# Patient Record
Sex: Female | Born: 1955 | Race: Asian | Hispanic: No | Marital: Married | Smoking: Never smoker
Health system: Southern US, Community
[De-identification: ages and names within clinical notes are randomized; demographics above are authoritative.]

## PROBLEM LIST (undated history)

## (undated) ENCOUNTER — Emergency Department (HOSPITAL_COMMUNITY): Payer: Self-pay

## (undated) DIAGNOSIS — I10 Essential (primary) hypertension: Secondary | ICD-10-CM

## (undated) DIAGNOSIS — T7840XA Allergy, unspecified, initial encounter: Secondary | ICD-10-CM

## (undated) DIAGNOSIS — E785 Hyperlipidemia, unspecified: Secondary | ICD-10-CM

## (undated) HISTORY — PX: EYE SURGERY: SHX253

## (undated) HISTORY — DX: Allergy, unspecified, initial encounter: T78.40XA

## (undated) HISTORY — DX: Hyperlipidemia, unspecified: E78.5

## (undated) HISTORY — DX: Essential (primary) hypertension: I10

---

## 2004-06-20 ENCOUNTER — Ambulatory Visit: Payer: Self-pay | Admitting: Family Medicine

## 2004-06-28 ENCOUNTER — Ambulatory Visit: Payer: Self-pay | Admitting: Family Medicine

## 2004-06-29 ENCOUNTER — Ambulatory Visit: Payer: Self-pay | Admitting: *Deleted

## 2004-09-23 ENCOUNTER — Ambulatory Visit: Payer: Self-pay | Admitting: Internal Medicine

## 2004-09-29 ENCOUNTER — Ambulatory Visit: Payer: Self-pay | Admitting: Family Medicine

## 2004-10-26 ENCOUNTER — Ambulatory Visit: Payer: Self-pay | Admitting: Internal Medicine

## 2004-10-31 ENCOUNTER — Ambulatory Visit: Payer: Self-pay | Admitting: Family Medicine

## 2004-11-01 ENCOUNTER — Ambulatory Visit: Payer: Self-pay | Admitting: *Deleted

## 2005-03-21 ENCOUNTER — Ambulatory Visit: Payer: Self-pay | Admitting: Family Medicine

## 2005-04-21 ENCOUNTER — Ambulatory Visit: Payer: Self-pay | Admitting: Family Medicine

## 2005-11-22 ENCOUNTER — Ambulatory Visit: Payer: Self-pay | Admitting: Family Medicine

## 2005-12-05 ENCOUNTER — Ambulatory Visit: Payer: Self-pay | Admitting: Family Medicine

## 2006-03-27 ENCOUNTER — Ambulatory Visit: Payer: Self-pay | Admitting: Family Medicine

## 2006-07-18 ENCOUNTER — Ambulatory Visit: Payer: Self-pay | Admitting: Family Medicine

## 2006-08-06 ENCOUNTER — Ambulatory Visit: Payer: Self-pay | Admitting: Family Medicine

## 2006-08-07 ENCOUNTER — Ambulatory Visit: Payer: Self-pay | Admitting: Family Medicine

## 2006-09-17 ENCOUNTER — Ambulatory Visit: Payer: Self-pay | Admitting: Family Medicine

## 2006-09-24 ENCOUNTER — Ambulatory Visit: Payer: Self-pay | Admitting: Family Medicine

## 2006-10-02 ENCOUNTER — Ambulatory Visit: Payer: Self-pay | Admitting: Family Medicine

## 2006-12-04 ENCOUNTER — Ambulatory Visit: Payer: Self-pay | Admitting: Internal Medicine

## 2006-12-17 ENCOUNTER — Ambulatory Visit: Payer: Self-pay | Admitting: Family Medicine

## 2007-03-20 ENCOUNTER — Encounter (INDEPENDENT_AMBULATORY_CARE_PROVIDER_SITE_OTHER): Payer: Self-pay | Admitting: *Deleted

## 2007-06-04 ENCOUNTER — Ambulatory Visit: Payer: Self-pay | Admitting: Internal Medicine

## 2007-09-09 ENCOUNTER — Encounter: Payer: Self-pay | Admitting: Family Medicine

## 2007-09-09 ENCOUNTER — Ambulatory Visit: Payer: Self-pay | Admitting: Internal Medicine

## 2007-09-09 ENCOUNTER — Encounter (INDEPENDENT_AMBULATORY_CARE_PROVIDER_SITE_OTHER): Payer: Self-pay | Admitting: Family Medicine

## 2007-09-09 LAB — CONVERTED CEMR LAB
ALT: 20 units/L (ref 0–35)
AST: 23 units/L (ref 0–37)
Basophils Absolute: 0.1 10*3/uL (ref 0.0–0.1)
Basophils Relative: 1 % (ref 0–1)
CO2: 25 meq/L (ref 19–32)
Cholesterol: 205 mg/dL — ABNORMAL HIGH (ref 0–200)
Creatinine, Ser: 0.82 mg/dL (ref 0.40–1.20)
Eosinophils Relative: 2 % (ref 0–5)
HCT: 42.9 % (ref 36.0–46.0)
HDL: 58 mg/dL (ref >39)
Hemoglobin: 13.8 g/dL (ref 12.0–15.0)
MCHC: 32.2 g/dL (ref 30.0–36.0)
Monocytes Absolute: 0.5 10*3/uL (ref 0.1–1.0)
RDW: 14.1 % (ref 11.5–15.5)
Total Bilirubin: 0.6 mg/dL (ref 0.3–1.2)
Total CHOL/HDL Ratio: 3.5
VLDL: 36 mg/dL (ref 0–40)

## 2007-09-12 ENCOUNTER — Ambulatory Visit (HOSPITAL_COMMUNITY): Admission: RE | Admit: 2007-09-12 | Discharge: 2007-09-12 | Payer: Self-pay | Admitting: Family Medicine

## 2007-09-23 ENCOUNTER — Ambulatory Visit: Payer: Self-pay | Admitting: Internal Medicine

## 2007-10-28 ENCOUNTER — Ambulatory Visit: Payer: Self-pay | Admitting: Internal Medicine

## 2007-10-28 ENCOUNTER — Encounter (INDEPENDENT_AMBULATORY_CARE_PROVIDER_SITE_OTHER): Payer: Self-pay | Admitting: Family Medicine

## 2007-10-28 LAB — CONVERTED CEMR LAB
Bilirubin Urine: NEGATIVE
Hemoglobin, Urine: NEGATIVE
Protein, ur: NEGATIVE mg/dL
Urobilinogen, UA: 0.2 (ref 0.0–1.0)
WBC, UA: NONE SEEN cells/hpf (ref <3)

## 2007-10-31 ENCOUNTER — Ambulatory Visit: Payer: Self-pay | Admitting: Family Medicine

## 2008-04-03 ENCOUNTER — Ambulatory Visit: Payer: Self-pay | Admitting: Internal Medicine

## 2008-04-09 ENCOUNTER — Ambulatory Visit: Payer: Self-pay | Admitting: Internal Medicine

## 2008-05-01 ENCOUNTER — Ambulatory Visit: Payer: Self-pay | Admitting: Internal Medicine

## 2008-05-01 ENCOUNTER — Encounter (INDEPENDENT_AMBULATORY_CARE_PROVIDER_SITE_OTHER): Payer: Self-pay | Admitting: Adult Health

## 2008-05-01 LAB — CONVERTED CEMR LAB
AST: 25 units/L (ref 0–37)
Alkaline Phosphatase: 42 units/L (ref 39–117)
BUN: 11 mg/dL (ref 6–23)
Basophils Relative: 1 % (ref 0–1)
Creatinine, Ser: 0.69 mg/dL (ref 0.40–1.20)
Eosinophils Absolute: 0.1 10*3/uL (ref 0.0–0.7)
Eosinophils Relative: 2 % (ref 0–5)
Glucose, Bld: 103 mg/dL — ABNORMAL HIGH (ref 70–99)
HCT: 42.7 % (ref 36.0–46.0)
HDL: 56 mg/dL (ref >39)
Hemoglobin: 14 g/dL (ref 12.0–15.0)
LDL Cholesterol: 101 mg/dL — ABNORMAL HIGH (ref 0–99)
Lymphs Abs: 1.6 10*3/uL (ref 0.7–4.0)
MCHC: 32.8 g/dL (ref 30.0–36.0)
MCV: 84.7 fL (ref 78.0–100.0)
Monocytes Absolute: 0.4 10*3/uL (ref 0.1–1.0)
Monocytes Relative: 7 % (ref 3–12)
RBC: 5.04 M/uL (ref 3.87–5.11)
T4, Total: 7.1 ug/dL (ref 5.0–12.5)
Total CHOL/HDL Ratio: 3.3
Triglycerides: 126 mg/dL (ref <150)

## 2008-07-06 ENCOUNTER — Ambulatory Visit: Payer: Self-pay | Admitting: Internal Medicine

## 2008-07-06 ENCOUNTER — Encounter (INDEPENDENT_AMBULATORY_CARE_PROVIDER_SITE_OTHER): Payer: Self-pay | Admitting: Adult Health

## 2008-07-06 LAB — CONVERTED CEMR LAB
ALT: 42 units/L — ABNORMAL HIGH (ref 0–35)
AST: 31 units/L (ref 0–37)
Alkaline Phosphatase: 49 units/L (ref 39–117)
BUN: 14 mg/dL (ref 6–23)
Basophils Absolute: 0.1 10*3/uL (ref 0.0–0.1)
Basophils Relative: 1 % (ref 0–1)
Calcium: 10 mg/dL (ref 8.4–10.5)
Chloride: 100 meq/L (ref 96–112)
Creatinine, Ser: 0.7 mg/dL (ref 0.40–1.20)
Eosinophils Absolute: 0.1 10*3/uL (ref 0.0–0.7)
HDL: 59 mg/dL (ref >39)
Helicobacter Pylori Antibody-IgG: <0.4
Hemoglobin: 14.9 g/dL (ref 12.0–15.0)
LDL Cholesterol: 109 mg/dL — ABNORMAL HIGH (ref 0–99)
MCHC: 33.7 g/dL (ref 30.0–36.0)
MCV: 81.1 fL (ref 78.0–100.0)
Monocytes Absolute: 0.5 10*3/uL (ref 0.1–1.0)
Monocytes Relative: 7 % (ref 3–12)
Neutro Abs: 4.1 10*3/uL (ref 1.7–7.7)
Neutrophils Relative %: 60 % (ref 43–77)
RDW: 13.4 % (ref 11.5–15.5)
Total CHOL/HDL Ratio: 3.5
VLDL: 36 mg/dL (ref 0–40)

## 2008-07-09 ENCOUNTER — Ambulatory Visit (HOSPITAL_COMMUNITY): Admission: RE | Admit: 2008-07-09 | Discharge: 2008-07-09 | Payer: Self-pay | Admitting: Internal Medicine

## 2008-07-09 ENCOUNTER — Encounter: Payer: Self-pay | Admitting: Internal Medicine

## 2008-07-20 ENCOUNTER — Ambulatory Visit: Payer: Self-pay | Admitting: Internal Medicine

## 2008-08-04 ENCOUNTER — Ambulatory Visit: Payer: Self-pay | Admitting: Internal Medicine

## 2013-09-22 ENCOUNTER — Other Ambulatory Visit: Payer: Self-pay | Admitting: Internal Medicine

## 2013-09-22 DIAGNOSIS — Z1231 Encounter for screening mammogram for malignant neoplasm of breast: Secondary | ICD-10-CM

## 2013-10-02 ENCOUNTER — Ambulatory Visit
Admission: RE | Admit: 2013-10-02 | Discharge: 2013-10-02 | Disposition: A | Discharge status: Home / Self Care | Payer: No Typology Code available for payment source | Source: Ambulatory Visit | Attending: Internal Medicine | Admitting: Internal Medicine

## 2013-10-02 DIAGNOSIS — Z1231 Encounter for screening mammogram for malignant neoplasm of breast: Secondary | ICD-10-CM

## 2013-10-07 ENCOUNTER — Other Ambulatory Visit: Payer: Self-pay | Admitting: Internal Medicine

## 2013-10-07 DIAGNOSIS — R928 Other abnormal and inconclusive findings on diagnostic imaging of breast: Secondary | ICD-10-CM

## 2013-11-04 ENCOUNTER — Encounter (INDEPENDENT_AMBULATORY_CARE_PROVIDER_SITE_OTHER): Payer: Self-pay

## 2013-11-04 ENCOUNTER — Ambulatory Visit
Admission: RE | Admit: 2013-11-04 | Discharge: 2013-11-04 | Disposition: A | Discharge status: Home / Self Care | Payer: No Typology Code available for payment source | Source: Ambulatory Visit | Attending: Internal Medicine | Admitting: Internal Medicine

## 2013-11-04 DIAGNOSIS — R928 Other abnormal and inconclusive findings on diagnostic imaging of breast: Secondary | ICD-10-CM

## 2014-02-10 ENCOUNTER — Ambulatory Visit (INDEPENDENT_AMBULATORY_CARE_PROVIDER_SITE_OTHER): Payer: No Typology Code available for payment source

## 2014-02-10 ENCOUNTER — Ambulatory Visit (INDEPENDENT_AMBULATORY_CARE_PROVIDER_SITE_OTHER): Payer: No Typology Code available for payment source | Admitting: Internal Medicine

## 2014-02-10 VITALS — BP 116/72 | HR 72 | Temp 98.0°F | Resp 20 | Ht 65.5 in | Wt 137.2 lb

## 2014-02-10 DIAGNOSIS — S39012A Strain of muscle, fascia and tendon of lower back, initial encounter: Secondary | ICD-10-CM

## 2014-02-10 DIAGNOSIS — M5430 Sciatica, unspecified side: Secondary | ICD-10-CM

## 2014-02-10 DIAGNOSIS — M5442 Lumbago with sciatica, left side: Secondary | ICD-10-CM

## 2014-02-10 DIAGNOSIS — M5441 Lumbago with sciatica, right side: Secondary | ICD-10-CM

## 2014-02-10 DIAGNOSIS — S335XXA Sprain of ligaments of lumbar spine, initial encounter: Secondary | ICD-10-CM

## 2014-02-10 MED ORDER — METHOCARBAMOL 750 MG PO TABS: 750.0000 mg | ORAL_TABLET | Freq: Four times a day (QID) | ORAL | Status: DC

## 2014-02-10 MED ORDER — HYDROCODONE-ACETAMINOPHEN 5-325 MG PO TABS: 1.0000 | ORAL_TABLET | Freq: Four times a day (QID) | ORAL | Status: DC | PRN

## 2014-02-10 MED ORDER — PREDNISONE 10 MG PO TABS: ORAL_TABLET | ORAL | Status: DC

## 2014-02-10 NOTE — Patient Instructions (Signed)

## 2014-02-10 NOTE — Progress Notes (Signed)
   Subjective:    Patient ID: Michelle Moran, female    DOB: October 03, 1955, 58 y.o.   MRN: [id number]  HPI    Review of Systems     Objective:   Physical Exam        Assessment & Plan:

## 2014-02-10 NOTE — Progress Notes (Signed)
   Subjective:    Patient ID: Michelle BrockRekha Moran, female    DOB: 1955-08-22, 58 y.o.   MRN: [id number]  HPI  Pt presents today with back pain, right worse than left. Noticed pain 4-5 days ago at home after lifting a heavy object at work.   Right leg radiculopathy. No numbness, no weakness, no incontinence.    Review of Systems     Objective:   Physical Exam  Constitutional: She is oriented to person, place, and time. She appears well-developed and well-nourished. She appears distressed.  HENT:  Head: Normocephalic.  Eyes: EOM are normal.  Neck: Normal range of motion.  Pulmonary/Chest: Effort normal.  Abdominal: Soft. There is no tenderness.  Musculoskeletal: She exhibits tenderness.  Neurological: She is alert and oriented to person, place, and time. She has normal strength and normal reflexes. She displays normal reflexes. No cranial nerve deficit or sensory deficit. She exhibits normal muscle tone. Coordination normal.  Reflex Scores:      Patellar reflexes are 2+ on the right side and 2+ on the left side.      Achilles reflexes are 2+ on the right side and 2+ on the left side. Straight leg raise is negative  Skin: No rash noted.  Psychiatric: She has a normal mood and affect. Her behavior is normal. Judgment and thought content normal.    [facility] reading (PRIMARY) by  Dr.Maliha Outten normal back xr        Assessment & Plan:  LB strain with right radiculopathy Prednisone/Vicodin/Robaxin Rest/No bending or lifting RTC 1 week

## 2014-11-05 ENCOUNTER — Other Ambulatory Visit: Payer: Self-pay | Admitting: Internal Medicine

## 2014-11-05 DIAGNOSIS — N63 Unspecified lump in unspecified breast: Secondary | ICD-10-CM

## 2014-11-11 ENCOUNTER — Other Ambulatory Visit: Payer: No Typology Code available for payment source

## 2014-11-17 ENCOUNTER — Ambulatory Visit
Admission: RE | Admit: 2014-11-17 | Discharge: 2014-11-17 | Disposition: A | Discharge status: Home / Self Care | Payer: 59 | Source: Ambulatory Visit | Attending: Internal Medicine | Admitting: Internal Medicine

## 2014-11-17 DIAGNOSIS — N63 Unspecified lump in unspecified breast: Secondary | ICD-10-CM

## 2019-03-04 ENCOUNTER — Encounter: Payer: Self-pay | Admitting: Cardiology

## 2019-03-04 ENCOUNTER — Inpatient Hospital Stay (HOSPITAL_COMMUNITY)
Admission: AD | Admit: 2019-03-04 | Discharge: 2019-03-06 | DRG: 244 | Disposition: A | Discharge status: Home / Self Care | Payer: Medicaid Other | Source: Ambulatory Visit | Attending: Cardiology | Admitting: Cardiology

## 2019-03-04 ENCOUNTER — Ambulatory Visit: Payer: Medicaid Other | Admitting: Cardiology

## 2019-03-04 ENCOUNTER — Other Ambulatory Visit: Payer: Self-pay

## 2019-03-04 VITALS — BP 184/81 | HR 40 | Temp 97.7°F | Ht 65.0 in | Wt 152.0 lb

## 2019-03-04 DIAGNOSIS — R0600 Dyspnea, unspecified: Secondary | ICD-10-CM

## 2019-03-04 DIAGNOSIS — K219 Gastro-esophageal reflux disease without esophagitis: Secondary | ICD-10-CM | POA: Diagnosis present

## 2019-03-04 DIAGNOSIS — I452 Bifascicular block: Secondary | ICD-10-CM | POA: Diagnosis present

## 2019-03-04 DIAGNOSIS — R7303 Prediabetes: Secondary | ICD-10-CM | POA: Diagnosis present

## 2019-03-04 DIAGNOSIS — R0609 Other forms of dyspnea: Secondary | ICD-10-CM | POA: Diagnosis present

## 2019-03-04 DIAGNOSIS — R42 Dizziness and giddiness: Secondary | ICD-10-CM

## 2019-03-04 DIAGNOSIS — Z79899 Other long term (current) drug therapy: Secondary | ICD-10-CM

## 2019-03-04 DIAGNOSIS — Z9049 Acquired absence of other specified parts of digestive tract: Secondary | ICD-10-CM

## 2019-03-04 DIAGNOSIS — I10 Essential (primary) hypertension: Secondary | ICD-10-CM | POA: Insufficient documentation

## 2019-03-04 DIAGNOSIS — Z8249 Family history of ischemic heart disease and other diseases of the circulatory system: Secondary | ICD-10-CM | POA: Diagnosis not present

## 2019-03-04 DIAGNOSIS — Z20828 Contact with and (suspected) exposure to other viral communicable diseases: Secondary | ICD-10-CM | POA: Diagnosis present

## 2019-03-04 DIAGNOSIS — R001 Bradycardia, unspecified: Secondary | ICD-10-CM

## 2019-03-04 DIAGNOSIS — R55 Syncope and collapse: Secondary | ICD-10-CM | POA: Diagnosis not present

## 2019-03-04 DIAGNOSIS — I442 Atrioventricular block, complete: Secondary | ICD-10-CM

## 2019-03-04 DIAGNOSIS — E782 Mixed hyperlipidemia: Secondary | ICD-10-CM | POA: Insufficient documentation

## 2019-03-04 DIAGNOSIS — E785 Hyperlipidemia, unspecified: Secondary | ICD-10-CM | POA: Diagnosis present

## 2019-03-04 DIAGNOSIS — Z950 Presence of cardiac pacemaker: Secondary | ICD-10-CM

## 2019-03-04 HISTORY — DX: Atrioventricular block, complete: I44.2

## 2019-03-04 LAB — CBC
HCT: 40.3 % (ref 36.0–46.0)
Hemoglobin: 13.6 g/dL (ref 12.0–15.0)
MCH: 28.7 pg (ref 26.0–34.0)
MCHC: 33.7 g/dL (ref 30.0–36.0)
MCV: 85 fL (ref 80.0–100.0)
Platelets: 265 10*3/uL (ref 150–400)
RBC: 4.74 MIL/uL (ref 3.87–5.11)
RDW: 13.1 % (ref 11.5–15.5)
WBC: 8.2 10*3/uL (ref 4.0–10.5)
nRBC: 0 % (ref 0.0–0.2)

## 2019-03-04 LAB — COMPREHENSIVE METABOLIC PANEL
ALT: 58 U/L — ABNORMAL HIGH (ref 0–44)
AST: 35 U/L (ref 15–41)
Albumin: 3.7 g/dL (ref 3.5–5.0)
Alkaline Phosphatase: 42 U/L (ref 38–126)
Anion gap: 12 (ref 5–15)
BUN: 9 mg/dL (ref 8–23)
CO2: 21 mmol/L — ABNORMAL LOW (ref 22–32)
Calcium: 9.2 mg/dL (ref 8.9–10.3)
Chloride: 105 mmol/L (ref 98–111)
Creatinine, Ser: 0.88 mg/dL (ref 0.44–1.00)
GFR calc Af Amer: >60 mL/min (ref >60)
GFR calc non Af Amer: >60 mL/min (ref >60)
Glucose, Bld: 177 mg/dL — ABNORMAL HIGH (ref 70–99)
Potassium: 3.9 mmol/L (ref 3.5–5.1)
Sodium: 138 mmol/L (ref 135–145)
Total Bilirubin: 0.9 mg/dL (ref 0.3–1.2)
Total Protein: 7 g/dL (ref 6.5–8.1)

## 2019-03-04 LAB — SARS CORONAVIRUS 2 (TAT 6-24 HRS): SARS Coronavirus 2: NEGATIVE

## 2019-03-04 MED ORDER — HYDRALAZINE HCL 20 MG/ML IJ SOLN: 10.0000 mg | INTRAMUSCULAR | Status: DC | PRN

## 2019-03-04 MED ORDER — SODIUM CHLORIDE 0.9% FLUSH
3.0000 mL | Freq: Two times a day (BID) | INTRAVENOUS | Status: DC
  Administered 2019-03-04 – 2019-03-05 (×2): 3 mL via INTRAVENOUS

## 2019-03-04 NOTE — Progress Notes (Signed)
Pts attending Dr. Virgina Jock notified of patients HR sustaining at 49. Pt asymptomatic. No further orders given at this time but to continue to monitor overnight unless patient becomes hemodynamically unstable. Per MD can expect patients heart rate to drop into the 30s overnight while sleeping. Pt to have pacemaker placement 03/05/2019. Pads on patient and defibrillator outside of room per order. Will continue to monitor.

## 2019-03-04 NOTE — Progress Notes (Signed)
Patient arrived as a direct admit to 4e18 patient placed on monitor and vital signs obtained and CHG completed. Patient oriented to room. Will monitor patient. Maximilien Hayashi, Bettina Gavia rN

## 2019-03-04 NOTE — Progress Notes (Signed)
EKG 02/12/2019 at PCP office

## 2019-03-04 NOTE — Addendum Note (Signed)
Addended by: Nigel Mormon on: 03/04/2019 04:41 PM   Modules accepted: Level of Service

## 2019-03-04 NOTE — Progress Notes (Signed)
Pt with orders for xray. Per xray until COVID screening comes back negative they cant take the patient. Asking nurse to call back when COVID results are in. Will continue to monitor.

## 2019-03-04 NOTE — H&P (Signed)
Office note from earlier today.Direct admit from office.   Patient referred by Bonnita Nasuti, MD for dizziness, bradycardia  Subjective:   Michelle Moran, female    DOB: Jan 10, 1956, 63 y.o.   MRN: [id number]      Chief Complaint  Patient presents with  . Bradycardia  . Dizziness  . New Patient (Initial Visit)     HPI  63 y.o. [place] female with hypertension, hyperlipidemia, GERD, referred for evaluation of dizziness and bradycardia.  Patient has been experiencing dizziness, especially with climbing upstairs, associated with exertional dyspnea. Two weeks ago, she had a sudden syncope while in shower, fortunately without any major injury.  She complains of left-sided focal, reproducible chest pain, that is present at rest and not worse on exertion.  Patient was seen by her PCP Dr. Jannette Fogo 4 days ago.  While her baseline EKG was normal in the past, EKG at Dr. Tenna Delaine office showed sinus rhythm, 2: 1 AV block, left bundle branch block.  He stopped patient's atenolol 25 mg.  However, patient continued to have symptoms.  EKG in the office today showed sinus rhythm, third-degree AV block, junctional escape rhythm at 40 bpm with right bundle branch block.       Past Medical History:  Diagnosis Date  . Allergy           Past Surgical History:  Procedure Laterality Date  . EYE SURGERY       Social History        Socioeconomic History  . Marital status: Married    Spouse name: Not on file  . Number of children: Not on file  . Years of education: Not on file  . Highest education level: Not on file  Occupational History  . Not on file  Social Needs  . Financial resource strain: Not on file  . Food insecurity    Worry: Not on file    Inability: Not on file  . Transportation needs    Medical: Not on file    Non-medical: Not on file  Tobacco Use  . Smoking status: Never Smoker  . Smokeless tobacco: Never Used  Substance and Sexual Activity   . Alcohol use: No  . Drug use: No  . Sexual activity: Not on file  Lifestyle  . Physical activity    Days per week: Not on file    Minutes per session: Not on file  . Stress: Not on file  Relationships  . Social Herbalist on phone: Not on file    Gets together: Not on file    Attends religious service: Not on file    Active member of club or organization: Not on file    Attends meetings of clubs or organizations: Not on file    Relationship status: Not on file  . Intimate partner violence    Fear of current or ex partner: Not on file    Emotionally abused: Not on file    Physically abused: Not on file    Forced sexual activity: Not on file  Other Topics Concern  . Not on file  Social History Narrative  . Not on file          Family History  Problem Relation Age of Onset  . Heart disease Father            Current Outpatient Medications on File Prior to Visit  Medication Sig Dispense Refill  . atenolol (TENORMIN) 25 MG tablet Take 25 mg  by mouth daily.    . celecoxib (CELEBREX) 200 MG capsule Take 200 mg by mouth 2 (two) times daily.    Marland Kitchen HYDROcodone-acetaminophen (NORCO/VICODIN) 5-325 MG per tablet Take 1 tablet by mouth every 6 (six) hours as needed. 30 tablet 0  . methocarbamol (ROBAXIN-750) 750 MG tablet Take 1 tablet (750 mg total) by mouth 4 (four) times daily. 40 tablet 1  . pravastatin (PRAVACHOL) 20 MG tablet Take 20 mg by mouth daily.    . predniSONE (DELTASONE) 10 MG tablet 6-5-4-3-2-1 po pc for back pain 21 tablet 0   No current facility-administered medications on file prior to visit.     Cardiovascular studies:  EKG 03/04/2019:  Sinus rhythm with third-degree AV block.   Junctional escape rhythm with underlying right bundle branch block, 42 bpm.   Left atrial enlargement.   Recent labs: 02/12/2019: Glucose 97.  BUN/creatinine 16/0.85.  EGFR 72.  Sodium 139, potassium 4.3.  AST 71, ALT 39. H/H  13.8/40.4.  MCV 83.  Platelets 309. Total cholesterol 213, TG 196, HDL 54, LDL 120 Hemoglobin A1c 6.1%. TSH 2.1 normal.  Free T4 1.25, mildly elevated.   Free T3 2.32, mildly reduced.   Review of Systems  Constitution: Negative for decreased appetite, malaise/fatigue, weight gain and weight loss.  HENT: Negative for congestion.   Eyes: Negative for visual disturbance.  Cardiovascular: Positive for chest pain (Focal, left sided chest pain, unrelated to exertion), dyspnea on exertion and syncope. Negative for leg swelling and palpitations.  Respiratory: Negative for cough.   Endocrine: Negative for cold intolerance.  Hematologic/Lymphatic: Does not bruise/bleed easily.  Skin: Negative for itching and rash.  Musculoskeletal: Negative for myalgias.  Gastrointestinal: Negative for abdominal pain, nausea and vomiting.  Genitourinary: Negative for dysuria.  Neurological: Positive for dizziness. Negative for weakness.  Psychiatric/Behavioral: The patient is not nervous/anxious.   All other systems reviewed and are negative.           Vitals:   03/04/19 1458  BP: (!) 184/81  Pulse: (!) 40  Temp: 97.7 F (36.5 C)  SpO2: 100%     Body mass index is 25.29 kg/m.    Filed Weights   03/04/19 1458  Weight: 152 lb (68.9 kg)     Objective:   Objective   Physical Exam  Constitutional: She is oriented to person, place, and time. She appears well-developed and well-nourished. No distress.  HENT:  Head: Normocephalic and atraumatic.  Eyes: Pupils are equal, round, and reactive to light. Conjunctivae are normal.  Neck: No JVD present.  Cardiovascular: Regular rhythm and intact distal pulses. Bradycardia present.  No murmur heard. Pulmonary/Chest: Effort normal and breath sounds normal. She has no wheezes. She has no rales.  Abdominal: Soft. Bowel sounds are normal. There is no rebound.  Musculoskeletal:        General: No edema.  Lymphadenopathy:    She has no  cervical adenopathy.  Neurological: She is alert and oriented to person, place, and time. No cranial nerve deficit.  Skin: Skin is warm and dry.  Psychiatric: She has a normal mood and affect.  Nursing note and vitals reviewed.         Assessment & Recommendations:   63 y.o. [place] female with hypertension, hyperlipidemia, prediabetes, GERD, with symptomatic third degree AV block.  Symptomatic third-degree AV block: Progressive conduction disease, now with third-degree AV block and junctional escape rhythm.  Symptoms of exertional dyspnea and unknown syncope are very concerning.  Recommend hospital admission with plan  for pacemaker placement tomorrow.  Will obtain echocardiogram.  If normal EF and normal wall motion abnormality, do not need ischemic work-up prior to pacemaker placement.   Hypertension:  Avoid AV nodal blocking agents.  Hydralazine ordered for as needed use if systolic blood pressure greater than 200 mmHg.  Hyperlipidemia: On pravastatin at baseline.  Nigel Mormon, MD After hours, contact office: [phone]

## 2019-03-04 NOTE — Progress Notes (Addendum)
Patient referred by Bonnita Nasuti, MD for dizziness, bradycardia  Subjective:   Michelle Moran, female    DOB: 07/10/1955, 63 y.o.   MRN: [id number]   Chief Complaint  Patient presents with  . Bradycardia  . Dizziness  . New Patient (Initial Visit)     HPI  63 y.o. [place] female with hypertension, hyperlipidemia, GERD, referred for evaluation of dizziness and bradycardia.  Patient has been experiencing dizziness, especially with climbing upstairs, associated with exertional dyspnea. Two weeks ago, she had a sudden syncope while in shower, fortunately without any major injury.  She complains of left-sided focal, reproducible chest pain, that is present at rest and not worse on exertion.  Patient was seen by her PCP Dr. Jannette Fogo 4 days ago.  While her baseline EKG was normal in the past, EKG at Dr. Tenna Delaine office showed sinus rhythm, 2: 1 AV block, left bundle branch block.  He stopped patient's atenolol 25 mg.  However, patient continued to have symptoms.  EKG in the office today showed sinus rhythm, third-degree AV block, junctional escape rhythm at 40 bpm with right bundle branch block.   Past Medical History:  Diagnosis Date  . Allergy      Past Surgical History:  Procedure Laterality Date  . EYE SURGERY       Social History   Socioeconomic History  . Marital status: Married    Spouse name: Not on file  . Number of children: Not on file  . Years of education: Not on file  . Highest education level: Not on file  Occupational History  . Not on file  Social Needs  . Financial resource strain: Not on file  . Food insecurity    Worry: Not on file    Inability: Not on file  . Transportation needs    Medical: Not on file    Non-medical: Not on file  Tobacco Use  . Smoking status: Never Smoker  . Smokeless tobacco: Never Used  Substance and Sexual Activity  . Alcohol use: No  . Drug use: No  . Sexual activity: Not on file  Lifestyle  . Physical activity     Days per week: Not on file    Minutes per session: Not on file  . Stress: Not on file  Relationships  . Social Herbalist on phone: Not on file    Gets together: Not on file    Attends religious service: Not on file    Active member of club or organization: Not on file    Attends meetings of clubs or organizations: Not on file    Relationship status: Not on file  . Intimate partner violence    Fear of current or ex partner: Not on file    Emotionally abused: Not on file    Physically abused: Not on file    Forced sexual activity: Not on file  Other Topics Concern  . Not on file  Social History Narrative  . Not on file     Family History  Problem Relation Age of Onset  . Heart disease Father      Current Outpatient Medications on File Prior to Visit  Medication Sig Dispense Refill  . atenolol (TENORMIN) 25 MG tablet Take 25 mg by mouth daily.    . celecoxib (CELEBREX) 200 MG capsule Take 200 mg by mouth 2 (two) times daily.    Marland Kitchen HYDROcodone-acetaminophen (NORCO/VICODIN) 5-325 MG per tablet Take 1 tablet by mouth  every 6 (six) hours as needed. 30 tablet 0  . methocarbamol (ROBAXIN-750) 750 MG tablet Take 1 tablet (750 mg total) by mouth 4 (four) times daily. 40 tablet 1  . pravastatin (PRAVACHOL) 20 MG tablet Take 20 mg by mouth daily.    . predniSONE (DELTASONE) 10 MG tablet 6-5-4-3-2-1 po pc for back pain 21 tablet 0   No current facility-administered medications on file prior to visit.     Cardiovascular studies:  EKG 03/04/2019:  Sinus rhythm with third-degree AV block.   Junctional escape rhythm with underlying right bundle branch block, 42 bpm.   Left atrial enlargement.   Recent labs: 02/12/2019: Glucose 97.  BUN/creatinine 16/0.85.  EGFR 72.  Sodium 139, potassium 4.3.  AST 71, ALT 39. H/H 13.8/40.4.  MCV 83.  Platelets 309. Total cholesterol 213, TG 196, HDL 54, LDL 120 Hemoglobin A1c 6.1%. TSH 2.1 normal.  Free T4 1.25, mildly elevated.    Free T3 2.32, mildly reduced.   Review of Systems  Constitution: Negative for decreased appetite, malaise/fatigue, weight gain and weight loss.  HENT: Negative for congestion.   Eyes: Negative for visual disturbance.  Cardiovascular: Positive for chest pain (Focal, left sided chest pain, unrelated to exertion), dyspnea on exertion and syncope. Negative for leg swelling and palpitations.  Respiratory: Negative for cough.   Endocrine: Negative for cold intolerance.  Hematologic/Lymphatic: Does not bruise/bleed easily.  Skin: Negative for itching and rash.  Musculoskeletal: Negative for myalgias.  Gastrointestinal: Negative for abdominal pain, nausea and vomiting.  Genitourinary: Negative for dysuria.  Neurological: Positive for dizziness. Negative for weakness.  Psychiatric/Behavioral: The patient is not nervous/anxious.   All other systems reviewed and are negative.        Vitals:   03/04/19 1458  BP: (!) 184/81  Pulse: (!) 40  Temp: 97.7 F (36.5 C)  SpO2: 100%     Body mass index is 25.29 kg/m. Filed Weights   03/04/19 1458  Weight: 152 lb (68.9 kg)     Objective:   Physical Exam  Constitutional: She is oriented to person, place, and time. She appears well-developed and well-nourished. No distress.  HENT:  Head: Normocephalic and atraumatic.  Eyes: Pupils are equal, round, and reactive to light. Conjunctivae are normal.  Neck: No JVD present.  Cardiovascular: Regular rhythm and intact distal pulses. Bradycardia present.  No murmur heard. Pulmonary/Chest: Effort normal and breath sounds normal. She has no wheezes. She has no rales.  Abdominal: Soft. Bowel sounds are normal. There is no rebound.  Musculoskeletal:        General: No edema.  Lymphadenopathy:    She has no cervical adenopathy.  Neurological: She is alert and oriented to person, place, and time. No cranial nerve deficit.  Skin: Skin is warm and dry.  Psychiatric: She has a normal mood and affect.   Nursing note and vitals reviewed.         Assessment & Recommendations:   63 y.o. [place] female with hypertension, hyperlipidemia, prediabetes, GERD, with symptomatic third degree AV block.  Symptomatic third-degree AV block: Progressive conduction disease, now with third-degree AV block and junctional escape rhythm.  Symptoms of exertional dyspnea and unknown syncope are very concerning.  Recommend hospital admission with plan for pacemaker placement tomorrow.  Will obtain echocardiogram.  If normal EF and normal wall motion abnormality, do not need ischemic work-up prior to pacemaker placement.   Hypertension:  Avoid AV nodal blocking agents.  Hydralazine ordered for as needed use if systolic blood  pressure greater than 200 mmHg.  Hyperlipidemia: On pravastatin at baseline.   Thank you for referring the patient to [place]. Please feel free to contact with any questions.    Nigel Mormon, MD [facility] Pager: [phone] Office: [phone] If no answer Cell [phone]

## 2019-03-05 ENCOUNTER — Encounter (HOSPITAL_COMMUNITY): Payer: Self-pay | Admitting: Internal Medicine

## 2019-03-05 ENCOUNTER — Inpatient Hospital Stay (HOSPITAL_COMMUNITY): Payer: Medicaid Other

## 2019-03-05 ENCOUNTER — Encounter (HOSPITAL_COMMUNITY)
Admission: AD | Disposition: A | Discharge status: Home / Self Care | Payer: Self-pay | Source: Ambulatory Visit | Attending: Cardiology

## 2019-03-05 DIAGNOSIS — I442 Atrioventricular block, complete: Principal | ICD-10-CM

## 2019-03-05 DIAGNOSIS — Z950 Presence of cardiac pacemaker: Secondary | ICD-10-CM

## 2019-03-05 DIAGNOSIS — R55 Syncope and collapse: Secondary | ICD-10-CM

## 2019-03-05 DIAGNOSIS — R0609 Other forms of dyspnea: Secondary | ICD-10-CM

## 2019-03-05 HISTORY — DX: Presence of cardiac pacemaker: Z95.0

## 2019-03-05 HISTORY — PX: PACEMAKER IMPLANT: EP1218

## 2019-03-05 LAB — ECHOCARDIOGRAM COMPLETE
Height: 65 in
Weight: 2440 oz

## 2019-03-05 LAB — SURGICAL PCR SCREEN
MRSA, PCR: NEGATIVE
Staphylococcus aureus: NEGATIVE

## 2019-03-05 LAB — GLUCOSE, CAPILLARY: Glucose-Capillary: 119 mg/dL — ABNORMAL HIGH (ref 70–99)

## 2019-03-05 SURGERY — PACEMAKER IMPLANT

## 2019-03-05 MED ORDER — FENTANYL CITRATE (PF) 100 MCG/2ML IJ SOLN
INTRAMUSCULAR | Status: DC | PRN
  Administered 2019-03-05: 12.5 ug via INTRAVENOUS

## 2019-03-05 MED ORDER — CEFAZOLIN SODIUM-DEXTROSE 2-4 GM/100ML-% IV SOLN
INTRAVENOUS | Status: AC
  Filled 2019-03-05: qty 100

## 2019-03-05 MED ORDER — HEPARIN (PORCINE) IN NACL 1000-0.9 UT/500ML-% IV SOLN
INTRAVENOUS | Status: AC
  Filled 2019-03-05: qty 500

## 2019-03-05 MED ORDER — ONDANSETRON HCL 4 MG/2ML IJ SOLN: 4.0000 mg | Freq: Four times a day (QID) | INTRAMUSCULAR | Status: DC | PRN

## 2019-03-05 MED ORDER — CEFAZOLIN SODIUM-DEXTROSE 1-4 GM/50ML-% IV SOLN
1.0000 g | Freq: Four times a day (QID) | INTRAVENOUS | Status: AC
  Administered 2019-03-05 – 2019-03-06 (×3): 1 g via INTRAVENOUS
  Filled 2019-03-05 (×3): qty 50

## 2019-03-05 MED ORDER — SODIUM CHLORIDE 0.9 % IV SOLN
INTRAVENOUS | Status: AC
  Filled 2019-03-05: qty 2

## 2019-03-05 MED ORDER — HEPARIN (PORCINE) IN NACL 1000-0.9 UT/500ML-% IV SOLN
INTRAVENOUS | Status: DC | PRN
  Administered 2019-03-05: 500 mL

## 2019-03-05 MED ORDER — SODIUM CHLORIDE 0.9 % IV SOLN
80.0000 mg | INTRAVENOUS | Status: AC
  Administered 2019-03-05: 80 mg

## 2019-03-05 MED ORDER — MIDAZOLAM HCL 5 MG/5ML IJ SOLN
INTRAMUSCULAR | Status: AC
  Filled 2019-03-05: qty 5

## 2019-03-05 MED ORDER — SODIUM CHLORIDE 0.9 % IV SOLN: INTRAVENOUS | Status: DC

## 2019-03-05 MED ORDER — CEFAZOLIN SODIUM-DEXTROSE 2-4 GM/100ML-% IV SOLN
2.0000 g | INTRAVENOUS | Status: AC
  Administered 2019-03-05: 12:00:00 2 g via INTRAVENOUS

## 2019-03-05 MED ORDER — FENTANYL CITRATE (PF) 100 MCG/2ML IJ SOLN
INTRAMUSCULAR | Status: AC
  Filled 2019-03-05: qty 2

## 2019-03-05 MED ORDER — LIDOCAINE HCL (PF) 1 % IJ SOLN
INTRAMUSCULAR | Status: AC
  Filled 2019-03-05: qty 90

## 2019-03-05 MED ORDER — MIDAZOLAM HCL 5 MG/5ML IJ SOLN
INTRAMUSCULAR | Status: DC | PRN
  Administered 2019-03-05: 1 mg via INTRAVENOUS

## 2019-03-05 MED ORDER — ACETAMINOPHEN 325 MG PO TABS
325.0000 mg | ORAL_TABLET | ORAL | Status: DC | PRN
  Administered 2019-03-05: 23:00:00 325 mg via ORAL
  Filled 2019-03-05: qty 1

## 2019-03-05 MED ORDER — LIDOCAINE HCL (PF) 1 % IJ SOLN
INTRAMUSCULAR | Status: DC | PRN
  Administered 2019-03-05: 45 mL

## 2019-03-05 SURGICAL SUPPLY — 7 items
CABLE SURGICAL S-101-97-12 (CABLE) ×2 IMPLANT
LEAD TENDRIL MRI 46CM LPA1200M (Lead) ×1 IMPLANT
LEAD TENDRIL MRI 52CM LPA1200M (Lead) ×1 IMPLANT
PACEMAKER ASSURITY DR-RF (Pacemaker) ×1 IMPLANT
PAD PRO RADIOLUCENT 2001M-C (PAD) ×2 IMPLANT
SHEATH 8FR PRELUDE SNAP 13 (SHEATH) ×2 IMPLANT
TRAY PACEMAKER INSERTION (PACKS) ×2 IMPLANT

## 2019-03-05 NOTE — Progress Notes (Signed)
Notified by CCMD of patient converting to second degree heart block type 2.  Heart rate sustaining between 39-50s. Patient asymptomatic. In bed rest comfortably on assessment. Will continue to monitor.

## 2019-03-05 NOTE — Discharge Instructions (Signed)
After Your Pacemaker   You have a [employer]. Jude Pacemaker   Do not lift your arm above shoulder height for 1 week after your procedure. After 7 days, you may progress as below.     Wednesday September 9  Thursday September 10 Friday September 11 Saturday September 12   Do not lift, push, pull, or carry anything over 10 pounds with the affected arm until 6 weeks (Wednesday April 16, 2019) after your procedure.    Monitor your pacemaker site for redness, swelling, and drainage. Call the device clinic at [phone] if you experience these symptoms or fever/chills.   If your incision is sealed with Steri-strips or staples. You may shower 7 days after your procedure and wash your incision with soap and water as long as it is healed. If your incision is closed with Dermabond/Surgical glue. You may shower 1 day after your pacemaker implant and wash your incision with soap and water. Avoid lotions, ointments, or perfumes over your incision until it is well-healed.   You may use a hot tub or a pool AFTER your wound check appointment if the incision is completely closed.   You may drive, unless driving has been restricted by your healthcare providers.   Your Pacemaker may be MRI compatible. We will discuss this at your first follow up/wound check. .    Remote monitoring is used to monitor your pacemaker from home. This monitoring is scheduled every 91 days by our office. It allows us to keep an eye on the functioning of your device to ensure it is working properly. You will routinely see your Electrophysiologist annually (more often if necessary).      Pacemaker Implantation, Care After This sheet gives you information about how to care for yourself after your procedure. Your health care provider may also give you more specific instructions. If you have problems or questions, contact your health care provider. What can I expect after the procedure? After the procedure, it is common to have:    Mild pain.  Slight bruising.  Some swelling over the incision.  A slight bump over the skin where the device was placed. Sometimes, it is possible to feel the device under the skin. This is normal.  You should received your Pacemaker ID card within 4-8 weeks. Follow these instructions at home: Medicines  Take over-the-counter and prescription medicines only as told by your health care provider.  If you were prescribed an antibiotic medicine, take it as told by your health care provider. Do not stop taking the antibiotic even if you start to feel better. Wound care     Do not remove the bandage on your chest until directed to do so by your health care provider.  After your bandage is removed, you may see pieces of tape called skin adhesive strips over the area where the cut was made (incision site). Let them fall off on their own.  Check the incision site every day to make sure it is not infected, bleeding, or starting to pull apart.  Do not use lotions or ointments near the incision site unless directed to do so.  Keep the incision area clean and dry for 7 days after the procedure or as directed by your health care provider. It takes several weeks for the incision site to completely heal.  Do not take baths, swim, or use a hot tub for 7-10 days or as otherwise directed by your health care provider. Activity  Do not drive or use heavy  machinery while taking prescription pain medicine.  Do not drive for 24 hours if you were given a medicine to help you relax (sedative).  Check with your health care provider before you start to drive or play sports.  Avoid sudden jerking, pulling, or chopping movements that pull your upper arm far away from your body. Avoid these movements for at least 6 weeks or as long as told by your health care provider.  Do not lift your upper arm above your shoulders for at least 6 weeks or as long as told by your health care provider. This means no  tennis, golf, or swimming.  You may go back to work when your health care provider says it is okay. Pacemaker care  You may be shown how to transfer data from your pacemaker through the phone to your health care provider.  Always let all health care providers know about your pacemaker before you have any medical procedures or tests.  Wear a medical ID bracelet or necklace stating that you have a pacemaker. Carry a pacemaker ID card with you at all times.  Your pacemaker battery will last for 5-15 years. Routine checks by your health care provider will let the health care provider know when the battery is starting to run down. The pacemaker will need to be replaced when the battery starts to run down.  Do not use amateur Chief of Staff. Other electrical devices are safe to use, including power tools, lawn mowers, and speakers. If you are unsure of whether something is safe to use, ask your health care provider.  When using your cell phone, hold it to the ear opposite the pacemaker. Do not leave your cell phone in a pocket over the pacemaker.  Avoid places or objects that have a strong electric or magnetic field, including: ? Airport Herbalist. When at the airport, let officials know that you have a pacemaker. ? Power plants. ? Large electrical generators. ? Radiofrequency transmission towers, such as cell phone and radio towers. General instructions  Weigh yourself every day. If you suddenly gain weight, fluid may be building up in your body.  Keep all follow-up visits as told by your health care provider. This is important. Contact a health care provider if:  You gain weight suddenly.  Your legs or feet swell.  It feels like your heart is fluttering or skipping beats (heart palpitations).  You have chills or a fever.  You have more redness, swelling, or pain around your incisions.  You have more fluid or blood coming from your  incisions.  Your incisions feel warm to the touch.  You have pus or a bad smell coming from your incisions. Get help right away if:  You have chest pain.  You have trouble breathing or are short of breath.  You become extremely tired.  You are light-headed or you faint. This information is not intended to replace advice given to you by your health care provider. Make sure you discuss any questions you have with your health care provider.

## 2019-03-05 NOTE — H&P (View-Only) (Signed)
ELECTROPHYSIOLOGY CONSULT NOTE    Patient ID: Michelle Moran MRN: [id number], DOB/AGE: Dec 07, 1955 63 y.o.  Admit date: 03/04/2019 Date of Consult: 03/05/2019  Primary Physician: Bonnita Nasuti, MD Primary Cardiologist: Dr. Virgina Jock Electrophysiologist: New to Dr. Lovena Le  Referring Provider: Dr. Virgina Jock  Patient Profile: Michelle Moran is a 63 y.o. female with a history of HTN, HLD, GERD who is being seen today for the evaluation of advanced AV block at the request of Dr. Rae Lips.  HPI:  Michelle Moran is a 63 y.o. female who had been experiencing dizziness, especially with climbing stairs and exertional dyspnea. Approx 2 weeks ago she had syncope while in the shower. Recent EKG noted new 2:1 AV block and LBBB. Pts atenolol stopped, but symptoms persisted. EKG 03/04/2019 in office showed third degree AV block with junctional escape at 40 bpm with RBBB.   She feels OK this am. She denies chest pain, palpitations, PND, orthopnea, nausea, vomiting, dizziness, further syncope, edema, weight gain, or early satiety. She remains SOB with mild to moderate exertion.   Past Medical History:  Diagnosis Date  . Allergy   . Hyperlipidemia   . Hypertension      Surgical History:  Past Surgical History:  Procedure Laterality Date  . CHOLECYSTECTOMY  2004  . EYE SURGERY    . INNER EAR SURGERY  2004     Medications Prior to Admission  Medication Sig Dispense Refill Last Dose  . atenolol (TENORMIN) 25 MG tablet Take 25 mg by mouth daily.     . celecoxib (CELEBREX) 200 MG capsule Take 200 mg by mouth 2 (two) times daily.     Marland Kitchen HYDROcodone-acetaminophen (NORCO/VICODIN) 5-325 MG per tablet Take 1 tablet by mouth every 6 (six) hours as needed. 30 tablet 0   . methocarbamol (ROBAXIN-750) 750 MG tablet Take 1 tablet (750 mg total) by mouth 4 (four) times daily. (Patient not taking: Reported on 03/04/2019) 40 tablet 1   . pravastatin (PRAVACHOL) 20 MG tablet Take 20 mg by mouth daily.     . predniSONE  (DELTASONE) 10 MG tablet 6-5-4-3-2-1 po pc for back pain 21 tablet 0     Inpatient Medications:  . sodium chloride flush  3 mL Intravenous Q12H    Allergies: No Known Allergies  Social History   Socioeconomic History  . Marital status: Married    Spouse name: Not on file  . Number of children: 3  . Years of education: Not on file  . Highest education level: Not on file  Occupational History  . Not on file  Social Needs  . Financial resource strain: Not on file  . Food insecurity    Worry: Not on file    Inability: Not on file  . Transportation needs    Medical: Not on file    Non-medical: Not on file  Tobacco Use  . Smoking status: Never Smoker  . Smokeless tobacco: Never Used  Substance and Sexual Activity  . Alcohol use: No  . Drug use: No  . Sexual activity: Not on file  Lifestyle  . Physical activity    Days per week: Not on file    Minutes per session: Not on file  . Stress: Not on file  Relationships  . Social Herbalist on phone: Not on file    Gets together: Not on file    Attends religious service: Not on file    Active member of club or organization: Not on file  Attends meetings of clubs or organizations: Not on file    Relationship status: Not on file  . Intimate partner violence    Fear of current or ex partner: Not on file    Emotionally abused: Not on file    Physically abused: Not on file    Forced sexual activity: Not on file  Other Topics Concern  . Not on file  Social History Narrative  . Not on file     Family History  Problem Relation Age of Onset  . Heart disease Father      Review of Systems: All other systems reviewed and are otherwise negative except as noted above.  Physical Exam: Vitals:   03/04/19 1842 03/04/19 1925 03/05/19 0021 03/05/19 0547  BP: (!) 152/59 (!) 152/75 (!) 143/63 (!) 147/66  Pulse: (!) 42 (!) 43 (!) 37 (!) 39  Resp: 17 14 18 11   Temp: 98.2 F (36.8 C) 97.6 F (36.4 C) 97.7 F (36.5 C)  97.9 F (36.6 C)  TempSrc: Oral Oral Oral Oral  SpO2: 100% 99% 99% 96%  Weight:    69.2 kg  Height:        GEN- The patient is well appearing, alert and oriented x 3 today.   HEENT: normocephalic, atraumatic; sclera clear, conjunctiva pink; hearing intact; oropharynx clear; neck supple Lungs- Clear to ausculation bilaterally, normal work of breathing.  No wheezes, rales, rhonchi Heart- Regular rate and rhythm, no murmurs, rubs or gallops GI- soft, non-tender, non-distended, bowel sounds present Extremities- no clubbing, cyanosis, or edema; DP/PT/radial pulses 2+ bilaterally MS- no significant deformity or atrophy Skin- warm and dry, no rash or lesion Psych- euthymic mood, full affect Neuro- strength and sensation are intact  Labs:   Lab Results  Component Value Date   WBC 8.2 03/04/2019   HGB 13.6 03/04/2019   HCT 40.3 03/04/2019   MCV 85.0 03/04/2019   PLT 265 03/04/2019    Recent Labs  Lab 03/04/19 1950  NA 138  K 3.9  CL 105  CO2 21*  BUN 9  CREATININE 0.88  CALCIUM 9.2  PROT 7.0  BILITOT 0.9  ALKPHOS 42  ALT 58*  AST 35  GLUCOSE 177*      Radiology/Studies: X-ray Chest Pa And Lateral  Result Date: 03/05/2019 CLINICAL DATA:  Syncope. EXAM: CHEST - 2 VIEW COMPARISON:  None. FINDINGS: Borderline cardiomegaly with normal mediastinal contours. Mild vascular congestion with cephalization of pulmonary vasculature. No focal airspace disease, pleural effusion or pneumothorax. No acute osseous abnormalities. IMPRESSION: Borderline cardiomegaly with mild vascular congestion. Electronically Signed   By: Narda RutherfordMelanie  Sanford M.D.   On: 03/05/2019 02:10    EKG: 03/04/2019 appears to be CHB with junctional escape at 39 bpm (personally reviewed)  TELEMETRY: Advanced heart block with 2:1 block and ? CHB with junctional escape 38 - 42 bpm (personally reviewed)  Assessment/Plan: 1.  Advanced AV block including 2:1 and ? third degree HB With recent history of dyspnea and syncope.   Junctional escape rhythm present in upper 30s lower 40s.  Discussed risks and benefits of PPM placement with patient. MD to see to decide disposition.  Echo pending with occasional left sided chest pain.  If normal, likely no need to further pursue ischemic work up at this time per Dr. Rosemary HolmsPatwardhan.  Will discuss timing of PPM placement with MD.    For questions or updates, please contact [facility] Please consult [email or web address] for contact info under Cardiology/STEMI.  Dustin FlockSigned, Michael Andrew Tillery, PA-C  03/05/2019  7:17 AM  EP Attending  Patient seen and examined. Agree with the findings as noted above. The patient has CHB and has been off of all AV nodal blocking drugs. She has had a single episode of syncope. She has had dyspnea. She has had a 2D echo demonstrating preserved LV function. I have discussed the treatment options and recommended insertion of a DDD PM. The risks/benefits/goals/expecations of PPM insertion were reviewed and she wishes to proceed.  Janya Eveland,M.D.     

## 2019-03-05 NOTE — Interval H&P Note (Signed)
History and Physical Interval Note:  03/05/2019 11:49 AM  Michelle Moran  has presented today for surgery, with the diagnosis of COMPLETE HEART BLOCK.  The various methods of treatment have been discussed with the patient and family. After consideration of risks, benefits and other options for treatment, the patient has consented to  Procedure(s): PACEMAKER IMPLANT (N/A) as a surgical intervention.  The patient's history has been reviewed, patient examined, no change in status, stable for surgery.  I have reviewed the patient's chart and labs.  Questions were answered to the patient's satisfaction.     Cristopher Peru

## 2019-03-05 NOTE — Progress Notes (Signed)
Subjective:  Doing well. No complaints this morning.  Objective:  Vital Signs in the last 24 hours: Temp:  [97.5 F (36.4 C)-98.2 F (36.8 C)] 97.5 F (36.4 C) (09/02 0759) Pulse Rate:  [37-43] 37 (09/02 0759) Resp:  [11-18] 12 (09/02 0759) BP: (139-184)/(59-81) 139/66 (09/02 0759) SpO2:  [96 %-100 %] 100 % (09/02 0759) Weight:  [68.9 kg-69.2 kg] 69.2 kg (09/02 0547)   Physical Exam Constitutional: She isoriented to person, place, and time. She appearswell-developedand well-nourished.No distress.  HENT:  Head:Normocephalicand atraumatic.  Eyes:Pupils are equal, round, and reactive to light.Conjunctivaeare normal.  Neck:No JVDpresent.  Cardiovascular:Regular rhythmand intact distal pulses.Bradycardiapresent.  No murmurheard. Pulmonary/Chest:Effort normaland breath sounds normal. She hasno wheezes. She hasno rales.  Abdominal:Soft.Bowel sounds are normal. There isno rebound.  Musculoskeletal:  General: No edema.  Lymphadenopathy:  She has no cervical adenopathy.  Neurological: She isalertand oriented to person, place, and time. Nocranial nerve deficit.  Skin: Skin iswarmand dry.  Psychiatric: She has anormal mood and affect. Nursing noteand vitalsreviewed.    Lab Results: BMP Recent Labs    03/04/19 1950  NA 138  K 3.9  CL 105  CO2 21*  GLUCOSE 177*  BUN 9  CREATININE 0.88  CALCIUM 9.2  GFRNONAA >60  GFRAA >60    CBC Recent Labs  Lab 03/04/19 1950  WBC 8.2  RBC 4.74  HGB 13.6  HCT 40.3  PLT 265  MCV 85.0  MCH 28.7  MCHC 33.7  RDW 13.1    HEMOGLOBIN A1C No results found for: HGBA1C, MPG  Cardiac Panel (last 3 results) No results for input(s): CKTOTAL, CKMB, TROPONINI, RELINDX in the last 8760 hours.  BNP (last 3 results) No results for input(s): BNP in the last 8760 hours.  TSH No results for input(s): TSH in the last 8760 hours.  Lipid Panel  No results found for: CHOL, TRIG, HDL, CHOLHDL, VLDL,  LDLCALC, LDLDIRECT   Hepatic Function Panel Recent Labs    03/04/19 1950  PROT 7.0  ALBUMIN 3.7  AST 35  ALT 58*  ALKPHOS 42  BILITOT 0.9    Cardiovascular studies:  Echocardiogram 03/05/2019:  1. The left ventricle has hyperdynamic systolic function, with an ejection fraction of >65%. The cavity size was normal. There is mildly increased left ventricular wall thickness. Left ventricular diastolic Doppler parameters are indeterminate.  2. The right ventricle has normal systolic function. The cavity was normal. There is no increase in right ventricular wall thickness.  3. Mild tricuspid regurgitation.  4. Trace tricuspid regurgitation. PASP 29 mmHg.  EKG 03/04/2019:  Sinus rhythm with third-degree AV block.  Junctional escape rhythm with underlying right bundle branch block, 42 bpm.  Left atrial enlargement.   Assessment & Recommendations:   63 y.o.Asian Indianfemalewith hypertension, hyperlipidemia,prediabetes,GERD, with symptomatic third degree AV block.  Symptomatic third-degree AV block: Progressive conduction disease, now with third-degree AV block and junctional escape rhythm. Symptoms of exertional dyspnea and unknown syncope are very concerning. Echo with normal LVEF and wall motion. Plan for pacemaker placement today.  Hypertension:  Avoid AV nodal blocking agents. Hydralazine ordered for as needed use if systolic blood pressure greater than 200 mmHg.  Hyperlipidemia: On pravastatin at baseline.  Elevated LFT's: Improved   Nigel Mormon, M.D. 03/05/2019, 8:30 AM [facility] Pager: [phone] Office: [phone] If no answer: [phone]

## 2019-03-05 NOTE — Progress Notes (Signed)
Notified by Vikki Ports, [place]. That patient came down on left arm (in sling) where chest incision site is located when attempting to straighten herself out in bed upon returning to the restroom. Per [facility] patient she was standing behind patient along with the IV pole. Patient got into bed by placing one knee in the bed then turning to sit. Once in bed [place] states she walked around the bed to plug in IV pole when patient somehow lost her balance and came down on left arm. Pt then begins to cry out "ouch ouch you were suppose to support me". Chelsea then comes around the bed and help her to her back. Pt then state she's going to call her husband and ask for a some water. Chelsea states she walks out the room to notify me and get pt water and returns to the room. Chelsea puts water on bedside table and pt turns her phone on her ans says "my husband". Patients husband states "she told you she had to go to the bathroom at 10 she needs help to the bathroom whenever you help her she needs to be supported". Patient husband starts to yelling saying "Why didn't you support her" repeatedly. Chelsea begins to explain to husband what happened per not above. Pt husband still saying "well why didn't you support her". Pt then turns her phone back to her and begins to speak her primary language. Chelsea then stated "I'm going to go get your nurse. On arrival to patients room patient tearful incision site intact no new drainage. Respiration in the low 100-110's rating pain at a score of 6. Blood pressure 160s/70s. SPO2 in the 90s on room air. Patient begins to explain to me that Vikki Ports was not supporting her enough which caused her to tip over and land on her arm. Pt states "Vikki Ports is rude and its not good for our service she left me when I was crying she should have stayed here and called you to come" She then calls her son to more explain to me what happened.  Son states "shes saying that chelsea did not support her when getting  back to bed and that when she get up she needs assistance. I spoke with patients son and explained to him that I was not present for a true account of what occurred. I assured patients son that she is stable at this point I would provide her with pain medication and her incision looks fine. I also notified the son that I would notify the physician of the situation and she would receive a new tech. Patient son voiced that her understood and started speaking back with patient. Dr. Virgina Jock updated on event and patients status and stated he will speak with her in the morning. Pt agreed with this. Patient given 325 mg of tylenol and helped to get comfortable in bed. I let the patient know that she can call my work phone personally for assistance to the restroom if she feels more comfortable. I was able to console patient she is now relaxed in bed with call bell and phone in reach. Will continue to monitor

## 2019-03-05 NOTE — Consult Note (Addendum)
ELECTROPHYSIOLOGY CONSULT NOTE    Patient ID: Michelle Moran MRN: [id number], DOB/AGE: Dec 07, 1955 63 y.o.  Admit date: 03/04/2019 Date of Consult: 03/05/2019  Primary Physician: Bonnita Nasuti, MD Primary Cardiologist: Dr. Virgina Jock Electrophysiologist: New to Dr. Lovena Le  Referring Provider: Dr. Virgina Jock  Patient Profile: Michelle Moran is a 63 y.o. female with a history of HTN, HLD, GERD who is being seen today for the evaluation of advanced AV block at the request of Dr. Rae Lips.  HPI:  Michelle Moran is a 63 y.o. female who had been experiencing dizziness, especially with climbing stairs and exertional dyspnea. Approx 2 weeks ago she had syncope while in the shower. Recent EKG noted new 2:1 AV block and LBBB. Pts atenolol stopped, but symptoms persisted. EKG 03/04/2019 in office showed third degree AV block with junctional escape at 40 bpm with RBBB.   She feels OK this am. She denies chest pain, palpitations, PND, orthopnea, nausea, vomiting, dizziness, further syncope, edema, weight gain, or early satiety. She remains SOB with mild to moderate exertion.   Past Medical History:  Diagnosis Date  . Allergy   . Hyperlipidemia   . Hypertension      Surgical History:  Past Surgical History:  Procedure Laterality Date  . CHOLECYSTECTOMY  2004  . EYE SURGERY    . INNER EAR SURGERY  2004     Medications Prior to Admission  Medication Sig Dispense Refill Last Dose  . atenolol (TENORMIN) 25 MG tablet Take 25 mg by mouth daily.     . celecoxib (CELEBREX) 200 MG capsule Take 200 mg by mouth 2 (two) times daily.     Marland Kitchen HYDROcodone-acetaminophen (NORCO/VICODIN) 5-325 MG per tablet Take 1 tablet by mouth every 6 (six) hours as needed. 30 tablet 0   . methocarbamol (ROBAXIN-750) 750 MG tablet Take 1 tablet (750 mg total) by mouth 4 (four) times daily. (Patient not taking: Reported on 03/04/2019) 40 tablet 1   . pravastatin (PRAVACHOL) 20 MG tablet Take 20 mg by mouth daily.     . predniSONE  (DELTASONE) 10 MG tablet 6-5-4-3-2-1 po pc for back pain 21 tablet 0     Inpatient Medications:  . sodium chloride flush  3 mL Intravenous Q12H    Allergies: No Known Allergies  Social History   Socioeconomic History  . Marital status: Married    Spouse name: Not on file  . Number of children: 3  . Years of education: Not on file  . Highest education level: Not on file  Occupational History  . Not on file  Social Needs  . Financial resource strain: Not on file  . Food insecurity    Worry: Not on file    Inability: Not on file  . Transportation needs    Medical: Not on file    Non-medical: Not on file  Tobacco Use  . Smoking status: Never Smoker  . Smokeless tobacco: Never Used  Substance and Sexual Activity  . Alcohol use: No  . Drug use: No  . Sexual activity: Not on file  Lifestyle  . Physical activity    Days per week: Not on file    Minutes per session: Not on file  . Stress: Not on file  Relationships  . Social Herbalist on phone: Not on file    Gets together: Not on file    Attends religious service: Not on file    Active member of club or organization: Not on file  Attends meetings of clubs or organizations: Not on file    Relationship status: Not on file  . Intimate partner violence    Fear of current or ex partner: Not on file    Emotionally abused: Not on file    Physically abused: Not on file    Forced sexual activity: Not on file  Other Topics Concern  . Not on file  Social History Narrative  . Not on file     Family History  Problem Relation Age of Onset  . Heart disease Father      Review of Systems: All other systems reviewed and are otherwise negative except as noted above.  Physical Exam: Vitals:   03/04/19 1842 03/04/19 1925 03/05/19 0021 03/05/19 0547  BP: (!) 152/59 (!) 152/75 (!) 143/63 (!) 147/66  Pulse: (!) 42 (!) 43 (!) 37 (!) 39  Resp: 17 14 18 11   Temp: 98.2 F (36.8 C) 97.6 F (36.4 C) 97.7 F (36.5 C)  97.9 F (36.6 C)  TempSrc: Oral Oral Oral Oral  SpO2: 100% 99% 99% 96%  Weight:    69.2 kg  Height:        GEN- The patient is well appearing, alert and oriented x 3 today.   HEENT: normocephalic, atraumatic; sclera clear, conjunctiva pink; hearing intact; oropharynx clear; neck supple Lungs- Clear to ausculation bilaterally, normal work of breathing.  No wheezes, rales, rhonchi Heart- Regular rate and rhythm, no murmurs, rubs or gallops GI- soft, non-tender, non-distended, bowel sounds present Extremities- no clubbing, cyanosis, or edema; DP/PT/radial pulses 2+ bilaterally MS- no significant deformity or atrophy Skin- warm and dry, no rash or lesion Psych- euthymic mood, full affect Neuro- strength and sensation are intact  Labs:   Lab Results  Component Value Date   WBC 8.2 03/04/2019   HGB 13.6 03/04/2019   HCT 40.3 03/04/2019   MCV 85.0 03/04/2019   PLT 265 03/04/2019    Recent Labs  Lab 03/04/19 1950  NA 138  K 3.9  CL 105  CO2 21*  BUN 9  CREATININE 0.88  CALCIUM 9.2  PROT 7.0  BILITOT 0.9  ALKPHOS 42  ALT 58*  AST 35  GLUCOSE 177*      Radiology/Studies: X-ray Chest Pa And Lateral  Result Date: 03/05/2019 CLINICAL DATA:  Syncope. EXAM: CHEST - 2 VIEW COMPARISON:  None. FINDINGS: Borderline cardiomegaly with normal mediastinal contours. Mild vascular congestion with cephalization of pulmonary vasculature. No focal airspace disease, pleural effusion or pneumothorax. No acute osseous abnormalities. IMPRESSION: Borderline cardiomegaly with mild vascular congestion. Electronically Signed   By: Narda RutherfordMelanie  Sanford M.D.   On: 03/05/2019 02:10    EKG: 03/04/2019 appears to be CHB with junctional escape at 39 bpm (personally reviewed)  TELEMETRY: Advanced heart block with 2:1 block and ? CHB with junctional escape 38 - 42 bpm (personally reviewed)  Assessment/Plan: 1.  Advanced AV block including 2:1 and ? third degree HB With recent history of dyspnea and syncope.   Junctional escape rhythm present in upper 30s lower 40s.  Discussed risks and benefits of PPM placement with patient. MD to see to decide disposition.  Echo pending with occasional left sided chest pain.  If normal, likely no need to further pursue ischemic work up at this time per Dr. Rosemary HolmsPatwardhan.  Will discuss timing of PPM placement with MD.    For questions or updates, please contact [facility] Please consult [email or web address] for contact info under Cardiology/STEMI.  Dustin FlockSigned, Michael Andrew Tillery, PA-C  03/05/2019  7:17 AM  EP Attending  Patient seen and examined. Agree with the findings as noted above. The patient has CHB and has been off of all AV nodal blocking drugs. She has had a single episode of syncope. She has had dyspnea. She has had a 2D echo demonstrating preserved LV function. I have discussed the treatment options and recommended insertion of a DDD PM. The risks/benefits/goals/expecations of PPM insertion were reviewed and she wishes to proceed.  Leonia Reeves.D.

## 2019-03-05 NOTE — Progress Notes (Signed)
Orthopedic Tech Progress Note Patient Details:  [name] Jan 28, 1956 [id number] RN said patient has on arm sling Patient ID: Michelle Moran, female   DOB: 1955/09/25, 63 y.o.   MRN: [id number]   Michelle Moran 03/05/2019, 2:06 PM

## 2019-03-05 NOTE — Progress Notes (Signed)
  Echocardiogram 2D Echocardiogram has been performed.  [name] [name] [name] 03/05/2019, 9:24 AM

## 2019-03-06 ENCOUNTER — Inpatient Hospital Stay (HOSPITAL_COMMUNITY): Payer: Medicaid Other

## 2019-03-06 LAB — GLUCOSE, CAPILLARY: Glucose-Capillary: 122 mg/dL — ABNORMAL HIGH (ref 70–99)

## 2019-03-06 MED ORDER — ACETAMINOPHEN 325 MG PO TABS: 325.0000 mg | ORAL_TABLET | Freq: Four times a day (QID) | ORAL | 0 refills | Status: AC | PRN

## 2019-03-06 NOTE — Discharge Summary (Signed)
Physician Discharge Summary  Patient ID: Michelle Moran MRN: [id number] DOB/AGE: 65-Aug-1957 65 y.o.  Admit date: 03/04/2019 Discharge date: 03/06/2019  Primary Discharge Diagnosis: Third degree AV block Hypertension  Secondary Discharge Diagnosis: Third degree AV block, s/p pacemaker Hypertension   Hospital Course:   63 y.o.Asian Indianfemalewith hypertension, hyperlipidemia,prediabetes,GERD, with symptomatic third degree AV block. She underwent successful dual chamber pacemaker placement by Dr. Lovena Le. Resume atenolol for hypertension.   Discharge Exam: Blood pressure (!) 142/80, pulse 76, temperature (!) 97.4 F (36.3 C), temperature source Oral, resp. rate 12, height 5\' 5"  (1.651 m), weight 70.5 kg, SpO2 96 %.   Physical Exam  Constitutional: She is oriented to person, place, and time. She appears well-developed and well-nourished. No distress.  HENT:  Head: Normocephalic and atraumatic.  Eyes: Pupils are equal, round, and reactive to light. Conjunctivae are normal.  Neck: No JVD present.  Cardiovascular: Normal rate, regular rhythm and intact distal pulses.  Pulmonary/Chest: Effort normal and breath sounds normal. She has no wheezes. She has no rales.  Pacemaker pocket with steristrips in place No hematoma  Abdominal: Soft. Bowel sounds are normal. There is no rebound.  Musculoskeletal:        General: No edema.  Lymphadenopathy:    She has no cervical adenopathy.  Neurological: She is alert and oriented to person, place, and time. No cranial nerve deficit.  Skin: Skin is warm and dry.  Psychiatric: She has a normal mood and affect.  Nursing note and vitals reviewed.   Recommendations on discharge:   Avoid overhead movements Continue wound care as per EP recommendations   Significant Diagnostic Studies:  EKG 03/06/2019: Sinus rhythm with ventricular pacing  Echocardiogram 03/05/2019:  1. The left ventricle has hyperdynamic systolic function, with an ejection  fraction of >65%. The cavity size was normal. There is mildly increased left ventricular wall thickness. Left ventricular diastolic Doppler parameters are indeterminate.  2. The right ventricle has normal systolic function. The cavity was normal. There is no increase in right ventricular wall thickness.  3. Mild tricuspid regurgitation.  4. Trace tricuspid regurgitation. PASP 29 mmHg.     Labs:   Lab Results  Component Value Date   WBC 8.2 03/04/2019   HGB 13.6 03/04/2019   HCT 40.3 03/04/2019   MCV 85.0 03/04/2019   PLT 265 03/04/2019    Recent Labs  Lab 03/04/19 1950  NA 138  K 3.9  CL 105  CO2 21*  BUN 9  CREATININE 0.88  CALCIUM 9.2  PROT 7.0  BILITOT 0.9  ALKPHOS 42  ALT 58*  AST 35  GLUCOSE 177*   Radiology: Dg Chest 2 View  Result Date: 03/06/2019 CLINICAL DATA:  Pacemaker placement. EXAM: CHEST - 2 VIEW COMPARISON:  Radiographs of March 05, 2019. FINDINGS: Stable cardiomediastinal silhouette. Interval placement of left-sided pacemaker with leads in grossly good position. No pneumothorax or pleural effusion is noted. No acute pulmonary disease is noted. Bony thorax is unremarkable. IMPRESSION: Interval placement of left-sided pacemaker with leads in grossly good position. No pneumothorax is noted. Electronically Signed   By: Marijo Conception M.D.   On: 03/06/2019 08:34   X-ray Chest Pa And Lateral  Result Date: 03/05/2019 CLINICAL DATA:  Syncope. EXAM: CHEST - 2 VIEW COMPARISON:  None. FINDINGS: Borderline cardiomegaly with normal mediastinal contours. Mild vascular congestion with cephalization of pulmonary vasculature. No focal airspace disease, pleural effusion or pneumothorax. No acute osseous abnormalities. IMPRESSION: Borderline cardiomegaly with mild vascular congestion. Electronically Signed   By:  Narda RutherfordMelanie  Sanford M.D.   On: 03/05/2019 02:10      FOLLOW UP PLANS AND APPOINTMENTS Discharge Instructions    Call MD for:  redness, tenderness, or signs of  infection (pain, swelling, redness, odor or green/yellow discharge around incision site)   Complete by: As directed    Diet - low sodium heart healthy   Complete by: As directed    Increase activity slowly   Complete by: As directed      Allergies as of 03/06/2019   No Known Allergies     Medication List    STOP taking these medications   celecoxib 200 MG capsule Commonly known as: CELEBREX   HYDROcodone-acetaminophen 5-325 MG tablet Commonly known as: NORCO/VICODIN   methocarbamol 750 MG tablet Commonly known as: Robaxin-750   predniSONE 10 MG tablet Commonly known as: DELTASONE     TAKE these medications   acetaminophen 325 MG tablet Commonly known as: TYLENOL Take 1-2 tablets (325-650 mg total) by mouth every 6 (six) hours as needed for mild pain.   albuterol 108 (90 Base) MCG/ACT inhaler Commonly known as: VENTOLIN HFA Inhale 1 puff into the lungs every 4 (four) hours as needed for wheezing.   atenolol 25 MG tablet Commonly known as: TENORMIN Take 25 mg by mouth daily.   cholecalciferol 25 MCG (1000 UT) tablet Commonly known as: VITAMIN D3 Take 1,000 Units by mouth daily.   clindamycin 1 % gel Commonly known as: CLINDAGEL Apply 1 application topically daily. X 30 days Notes to patient: Use as directed    esomeprazole 40 MG capsule Commonly known as: NEXIUM Take 40 mg by mouth daily.   Fish Oil 1000 MG Cpdr Take 1,000 mg by mouth daily.   pravastatin 20 MG tablet Commonly known as: PRAVACHOL Take 20 mg by mouth daily.      Follow-up Information    [facility] Follow up on 03/18/2019.   Why: at 10 for post pacemaker wound/device check Contact information: [address] [place] [phone]       Marinus Mawaylor, Gregg W, MD Follow up on 06/05/2019.   Specialty: Cardiology Why: at 3 pm for 3 month pacemaker check Contact information: [address] [place] [address] [phone]        Elder NegusPatwardhan, Brietta Manso J, MD Follow up on 03/20/2019.   Specialties: Cardiology, Radiology Why: 3:30 PM Contact information: [address] [place] [address] [phone]            Truett MainlandManish Itzae Mccurdy MD, [facility] Pager: [phone] Office: [phone] If no answer: [phone]

## 2019-03-06 NOTE — Progress Notes (Addendum)
Electrophysiology Rounding Note  Patient Name: Michelle BrockRekha Moran Date of Encounter: 03/06/2019  Primary Cardiologist: Dr. Rosemary HolmsPatwardhan Electrophysiologist: Dr. Ladona Ridgelaylor   Subjective   S/p St Jude Dual chamber PPM 03/05/2019 (PPM Assurity Model# [id number], RA lead Tendril MRI [id number], RV lead Tendril MRI [id number])  The patient is doing OK this am.  She fell forward "onto" her left arm yesterday getting in to bed. She is sore this am, but OK. Device interrogation OK.  At this time, the patient denies chest pain, shortness of breath, or any new concerns.  CXR 03/06/19 demonstrates stable lead placement and no pneumothorax s/p device placement.   Inpatient Medications    Scheduled Meds: . sodium chloride flush  3 mL Intravenous Q12H   Continuous Infusions:  PRN Meds: acetaminophen, hydrALAZINE, ondansetron (ZOFRAN) IV   Vital Signs    Vitals:   03/05/19 2240 03/05/19 2300 03/06/19 0009 03/06/19 0349  BP: (!) 166/87  135/74 (!) 143/94  Pulse: (!) 105 93 95 86  Resp: 13 20 20 16   Temp: 97.8 F (36.6 C)  97.8 F (36.6 C) 97.6 F (36.4 C)  TempSrc: Oral  Oral Oral  SpO2: 99% 96% 100% 99%  Weight:    70.5 kg  Height:        Intake/Output Summary (Last 24 hours) at 03/06/2019 0713 Last data filed at 03/06/2019 [id number] Gross per 24 hour  Intake 133.33 ml  Output 1 ml  Net 132.33 ml   Filed Weights   03/04/19 1700 03/05/19 0547 03/06/19 0349  Weight: 69.1 kg 69.2 kg 70.5 kg    Physical Exam    GEN- The patient is well appearing, alert and oriented x 3 today.   Head- normocephalic, atraumatic Eyes-  Sclera clear, conjunctiva pink Ears- hearing intact Oropharynx- clear Neck- supple Lungs- Clear to ausculation bilaterally, normal work of breathing Heart- Regular rate and rhythm, no murmurs, rubs or gallops Pacer site dressing C/D/I without ecchymosis or hematoma GI- soft, NT, ND, + BS Extremities- no clubbing, cyanosis, or edema Skin- no rash or lesion Psych- euthymic mood,  full affect Neuro- strength and sensation are intact  Labs    CBC Recent Labs    03/04/19 1950  WBC 8.2  HGB 13.6  HCT 40.3  MCV 85.0  PLT 265   Basic Metabolic Panel Recent Labs    02/72/5307/07/23 1950  NA 138  K 3.9  CL 105  CO2 21*  GLUCOSE 177*  BUN 9  CREATININE 0.88  CALCIUM 9.2   Liver Function Tests Recent Labs    03/04/19 1950  AST 35  ALT 58*  ALKPHOS 42  BILITOT 0.9  PROT 7.0  ALBUMIN 3.7   No results for input(s): LIPASE, AMYLASE in the last 72 hours. Cardiac Enzymes No results for input(s): CKTOTAL, CKMB, CKMBINDEX, TROPONINI in the last 72 hours. BNP Invalid input(s): POCBNP D-Dimer No results for input(s): DDIMER in the last 72 hours. Hemoglobin A1C No results for input(s): HGBA1C in the last 72 hours. Fasting Lipid Panel No results for input(s): CHOL, HDL, LDLCALC, TRIG, CHOLHDL, LDLDIRECT in the last 72 hours. Thyroid Function Tests No results for input(s): TSH, T4TOTAL, T3FREE, THYROIDAB in the last 72 hours.  Invalid input(s): FREET3  Telemetry    V pacing 70s-80s, one episode of approx 5 minute of pacing ~90-100  (personally reviewed)  Radiology    X-ray Chest Pa And Lateral  Result Date: 03/05/2019 CLINICAL DATA:  Syncope. EXAM: CHEST - 2 VIEW COMPARISON:  None. FINDINGS: Borderline cardiomegaly with  normal mediastinal contours. Mild vascular congestion with cephalization of pulmonary vasculature. No focal airspace disease, pleural effusion or pneumothorax. No acute osseous abnormalities. IMPRESSION: Borderline cardiomegaly with mild vascular congestion. Electronically Signed   By: Keith Rake M.D.   On: 03/05/2019 02:10     Patient Profile     Michelle Moran is a 63 y.o. female with a past medical history significant for HTN, HLD, and GERD.  She was admitted for symptomatic bradycardia and PPM consideration.  Assessment & Plan    1. Symptomatic bradycardia in the setting of advanced heart block including CHB and 2:1 block S/p  St Jude dual chamber PPM 03/05/2019 Stable device function this am.  CXR this am with stable lead placement and no pneumothorax s/p device placement Follow up placed in chart. Instructions and wound care reviewed at length.  Have encouraged her to have her son call with any questions (per her he speaks better english)  2. HTN With PPM in place, OK to restart atenolol.   Pt stable for discharge from EP perspective. Please call back with any further questions.   For questions or updates, please contact [place] Please consult [email or web address] for contact info under Cardiology/STEMI.  Signed, Shirley Friar, PA-C  03/06/2019, 7:13 AM    EP Attending  Patient seen and examined. Agree with the findings as noted above. The patient is doing well after DDD PPM insertion. She will be discharged home with usual followup. Her PPM interrogation under my direction demonstrates normal DDD PM function. CXR looks good.  Mikle Bosworth.D.

## 2019-03-12 ENCOUNTER — Telehealth: Payer: Self-pay

## 2019-03-18 ENCOUNTER — Other Ambulatory Visit: Payer: Self-pay

## 2019-03-18 ENCOUNTER — Ambulatory Visit (INDEPENDENT_AMBULATORY_CARE_PROVIDER_SITE_OTHER): Payer: Medicaid Other | Admitting: *Deleted

## 2019-03-18 DIAGNOSIS — R001 Bradycardia, unspecified: Secondary | ICD-10-CM | POA: Diagnosis not present

## 2019-03-18 LAB — CUP PACEART INCLINIC DEVICE CHECK
Battery Remaining Longevity: 122 mo
Battery Voltage: 3.05 V
Brady Statistic RA Percent Paced: 13 %
Brady Statistic RV Percent Paced: 99.99 %
Date Time Interrogation Session: 20200915135350
Implantable Lead Implant Date: 20200902
Implantable Lead Implant Date: 20200902
Implantable Lead Location: 753859
Implantable Lead Location: 753860
Implantable Pulse Generator Implant Date: 20200902
Lead Channel Impedance Value: 462.5 Ohm
Lead Channel Impedance Value: 562.5 Ohm
Lead Channel Pacing Threshold Amplitude: 0.5 V
Lead Channel Pacing Threshold Amplitude: 0.75 V
Lead Channel Pacing Threshold Pulse Width: 0.5 ms
Lead Channel Pacing Threshold Pulse Width: 0.5 ms
Lead Channel Sensing Intrinsic Amplitude: 11.9 mV
Lead Channel Sensing Intrinsic Amplitude: 3.5 mV
Lead Channel Setting Pacing Amplitude: 0.75 V
Lead Channel Setting Pacing Amplitude: 3.5 V
Lead Channel Setting Pacing Pulse Width: 0.5 ms
Lead Channel Setting Sensing Sensitivity: 4 mV
Pulse Gen Model: 2272
Pulse Gen Serial Number: 9159553

## 2019-03-18 NOTE — Patient Instructions (Signed)
Call office if you have drainage , redness or increased swelling at wound site.

## 2019-03-18 NOTE — Progress Notes (Signed)
Wound check appointment. Steri-strips removed. Wound without redness or edema. Incision edges approximated, wound well healed. Normal device function. Thresholds, sensing, and impedances consistent with implant measurements. Device programmed at 3.5V/auto capture programmed on for extra safety margin until 3 month visit. Histogram distribution appropriate for patient and level of activity. Mode switch l< 1%,  1 AMS episode that appears to be AT. NO high ventricular rates noted. Patient educated about wound care, arm mobility, lifting restrictions. ROV i12/09/2018 with Dr Lovena Le. remote transmissions scheduled every 3 months.

## 2019-03-20 ENCOUNTER — Other Ambulatory Visit: Payer: Self-pay

## 2019-03-20 ENCOUNTER — Ambulatory Visit (INDEPENDENT_AMBULATORY_CARE_PROVIDER_SITE_OTHER): Payer: Medicaid Other | Admitting: Cardiology

## 2019-03-20 ENCOUNTER — Encounter: Payer: Self-pay | Admitting: Cardiology

## 2019-03-20 VITALS — BP 132/86 | HR 77 | Ht 60.5 in | Wt 154.9 lb

## 2019-03-20 DIAGNOSIS — I10 Essential (primary) hypertension: Secondary | ICD-10-CM | POA: Diagnosis not present

## 2019-03-20 DIAGNOSIS — Z950 Presence of cardiac pacemaker: Secondary | ICD-10-CM | POA: Diagnosis not present

## 2019-03-20 DIAGNOSIS — I442 Atrioventricular block, complete: Secondary | ICD-10-CM

## 2019-03-20 NOTE — Progress Notes (Signed)
Patient referred by Bonnita Nasuti, MD for dizziness, bradycardia  Subjective:   Michelle Moran, female    DOB: May 09, 1956, 63 y.o.   MRN: [id number]   Chief Complaint  Patient presents with  . AV Block    post hospital      HPI   63 y.o. Asian [place] female with hypertension, hyperlipidemia, prediabetes, GERD, now s/p dual chamber pacemaker for symptomatic third degree AV block.   Patient has not had any exertional dyspnea or syncope episodes since pacemaker placement.  Blood pressure is better controlled, but continues to be higher in the evening.  He is currently taking atenolol 25 mg once daily.  Patient underwent pacemaker interrogation and wound check with Dr. Tanna Furry office yesterday.  Normal device function. Thresholds, sensing, and impedances consistent with implant measurements. Device programmed at 3.5V/auto capture programmed on for extra safety margin until 3 month visit. Histogram distribution appropriate for patient and level of activity. Mode switch l< 1%,  1 AMS episode that appears to be AT. NO high ventricular rates noted. Patient educated about wound care, arm mobility, lifting restrictions.  Past Medical History:  Diagnosis Date  . Allergy   . Hyperlipidemia   . Hypertension      Past Surgical History:  Procedure Laterality Date  . CHOLECYSTECTOMY  2004  . EYE SURGERY    . INNER EAR SURGERY  2004  . PACEMAKER IMPLANT N/A 03/05/2019   Procedure: PACEMAKER IMPLANT;  Surgeon: Evans Lance, MD;  Location: [facility];  Service: Cardiovascular;  Laterality: N/A;     Social History   Socioeconomic History  . Marital status: Married    Spouse name: Not on file  . Number of children: 3  . Years of education: Not on file  . Highest education level: Not on file  Occupational History  . Not on file  Social Needs  . Financial resource strain: Not on file  . Food insecurity    Worry: Not on file    Inability: Not on file  .  Transportation needs    Medical: Not on file    Non-medical: Not on file  Tobacco Use  . Smoking status: Never Smoker  . Smokeless tobacco: Never Used  Substance and Sexual Activity  . Alcohol use: No  . Drug use: No  . Sexual activity: Not on file  Lifestyle  . Physical activity    Days per week: Not on file    Minutes per session: Not on file  . Stress: Not on file  Relationships  . Social Herbalist on phone: Not on file    Gets together: Not on file    Attends religious service: Not on file    Active member of club or organization: Not on file    Attends meetings of clubs or organizations: Not on file    Relationship status: Not on file  . Intimate partner violence    Fear of current or ex partner: Not on file    Emotionally abused: Not on file    Physically abused: Not on file    Forced sexual activity: Not on file  Other Topics Concern  . Not on file  Social History Narrative  . Not on file     Family History  Problem Relation Age of Onset  . Heart disease Father      Current Outpatient Medications on File Prior to Visit  Medication Sig Dispense Refill  . acetaminophen (TYLENOL)  325 MG tablet Take 1-2 tablets (325-650 mg total) by mouth every 6 (six) hours as needed for mild pain. 30 tablet 0  . albuterol (VENTOLIN HFA) 108 (90 Base) MCG/ACT inhaler Inhale 1 puff into the lungs every 4 (four) hours as needed for wheezing.    Marland Kitchen atenolol (TENORMIN) 25 MG tablet Take 25 mg by mouth daily.    . cholecalciferol (VITAMIN D3) 25 MCG (1000 UT) tablet Take 1,000 Units by mouth daily.    . clindamycin (CLINDAGEL) 1 % gel Apply 1 application topically daily. X 30 days    . esomeprazole (NEXIUM) 40 MG capsule Take 40 mg by mouth daily.    . Omega-3 Fatty Acids (FISH OIL) 1000 MG CPDR Take 1,000 mg by mouth daily.    . pravastatin (PRAVACHOL) 20 MG tablet Take 20 mg by mouth daily.     No current facility-administered medications on file prior to visit.      Cardiovascular studies:  03/05/2019 (Dr/. Cristopher Peru):  1. Successful implantation of a St. Jude dual-chamber pacemaker for symptomatic bradycardia due to complete heart block  2. No early apparent complications.      Echocardiogram 03/04/2019:  1. The left ventricle has hyperdynamic systolic function, with an ejection fraction of >65%. The cavity size was normal. There is mildly increased left ventricular wall thickness. Left ventricular diastolic Doppler parameters are indeterminate.  2. The right ventricle has normal systolic function. The cavity was normal. There is no increase in right ventricular wall thickness.  3. Mild tricuspid regurgitation.  4. Trace tricuspid regurgitation. PASP 29 mmHg.  EKG 03/04/2019:  Sinus rhythm with third-degree AV block.   Junctional escape rhythm with underlying right bundle branch block, 42 bpm.   Left atrial enlargement.   Recent labs: 02/12/2019: Glucose 97.  BUN/creatinine 16/0.85.  EGFR 72.  Sodium 139, potassium 4.3.  AST 71, ALT 39. H/H 13.8/40.4.  MCV 83.  Platelets 309. Total cholesterol 213, TG 196, HDL 54, LDL 120 Hemoglobin A1c 6.1%. TSH 2.1 normal.  Free T4 1.25, mildly elevated.   Free T3 2.32, mildly reduced.   Review of Systems  Constitution: Negative for decreased appetite, malaise/fatigue, weight gain and weight loss.  HENT: Negative for congestion.   Eyes: Negative for visual disturbance.  Cardiovascular: Negative for chest pain, dyspnea on exertion, leg swelling, palpitations and syncope.  Respiratory: Negative for cough.   Endocrine: Negative for cold intolerance.  Hematologic/Lymphatic: Does not bruise/bleed easily.  Skin: Negative for itching and rash.  Musculoskeletal: Negative for myalgias.  Gastrointestinal: Negative for abdominal pain, nausea and vomiting.  Genitourinary: Negative for dysuria.  Neurological: Positive for dizziness. Negative for weakness.  Psychiatric/Behavioral: The patient is not nervous/anxious.    All other systems reviewed and are negative.        Vitals:   03/20/19 1528  BP: 132/86  Pulse: 77  SpO2: 97%    Body mass index is 29.75 kg/m. Filed Weights   03/20/19 1528  Weight: 154 lb 14.4 oz (70.3 kg)     Objective:   Physical Exam  Constitutional: She is oriented to person, place, and time. She appears well-developed and well-nourished. No distress.  HENT:  Head: Normocephalic and atraumatic.  Eyes: Pupils are equal, round, and reactive to light. Conjunctivae are normal.  Neck: No JVD present.  Cardiovascular: Normal rate, regular rhythm and intact distal pulses.  No murmur heard. Pacemaker pocket scar, healing well.  Pulmonary/Chest: Effort normal and breath sounds normal. She has no wheezes. She has no rales.  Abdominal: Soft. Bowel sounds are normal. There is no rebound.  Musculoskeletal:        General: No edema.  Lymphadenopathy:    She has no cervical adenopathy.  Neurological: She is alert and oriented to person, place, and time. No cranial nerve deficit.  Skin: Skin is warm and dry.  Psychiatric: She has a normal mood and affect.  Nursing note and vitals reviewed.         Assessment & Recommendations:   63 y.o. [place] female with hypertension, hyperlipidemia, prediabetes, GERD, now s/p dual chamber pacemaker for symptomatic third degree AV block.  Symptomatic third-degree AV block: S/p St Jude dual chamber pacemaker by Dr. Lovena Le 03/05/2019. Normal function.  1 atrial tachycardia episode with no high ventricular episodes.  Hypertension:  Increase atenolol to 20.5 mg am and 12.5 mg p.m.  Hyperlipidemia: On pravastatin at baseline.   Follow-up in 3 months.   Nigel Mormon, MD [facility] Pager: [phone] Office: [phone] If no answer Cell [phone]

## 2019-04-03 NOTE — Telephone Encounter (Signed)
error 

## 2019-06-05 ENCOUNTER — Encounter: Payer: Self-pay | Admitting: Internal Medicine

## 2019-06-05 ENCOUNTER — Encounter (INDEPENDENT_AMBULATORY_CARE_PROVIDER_SITE_OTHER): Payer: Self-pay

## 2019-06-05 ENCOUNTER — Ambulatory Visit (INDEPENDENT_AMBULATORY_CARE_PROVIDER_SITE_OTHER): Payer: Medicare Other | Admitting: Internal Medicine

## 2019-06-05 ENCOUNTER — Ambulatory Visit (INDEPENDENT_AMBULATORY_CARE_PROVIDER_SITE_OTHER): Payer: Medicaid Other | Admitting: *Deleted

## 2019-06-05 ENCOUNTER — Other Ambulatory Visit: Payer: Self-pay

## 2019-06-05 VITALS — BP 118/78 | HR 65 | Ht 65.0 in | Wt 156.0 lb

## 2019-06-05 DIAGNOSIS — I10 Essential (primary) hypertension: Secondary | ICD-10-CM

## 2019-06-05 DIAGNOSIS — Z950 Presence of cardiac pacemaker: Secondary | ICD-10-CM

## 2019-06-05 DIAGNOSIS — R001 Bradycardia, unspecified: Secondary | ICD-10-CM

## 2019-06-05 DIAGNOSIS — I442 Atrioventricular block, complete: Secondary | ICD-10-CM

## 2019-06-05 LAB — CUP PACEART REMOTE DEVICE CHECK
Battery Remaining Longevity: 104 mo
Battery Remaining Percentage: 95.5 %
Battery Voltage: 3.04 V
Brady Statistic AP VP Percent: 11 %
Brady Statistic AP VS Percent: 1 %
Brady Statistic AS VP Percent: 87 %
Brady Statistic AS VS Percent: 2.3 %
Brady Statistic RA Percent Paced: 11 %
Brady Statistic RV Percent Paced: 97 %
Date Time Interrogation Session: 20201203020014
Implantable Lead Implant Date: 20200902
Implantable Lead Implant Date: 20200902
Implantable Lead Location: 753859
Implantable Lead Location: 753860
Implantable Pulse Generator Implant Date: 20200902
Lead Channel Impedance Value: 490 Ohm
Lead Channel Impedance Value: 550 Ohm
Lead Channel Pacing Threshold Amplitude: 0.5 V
Lead Channel Pacing Threshold Amplitude: 0.75 V
Lead Channel Pacing Threshold Pulse Width: 0.5 ms
Lead Channel Pacing Threshold Pulse Width: 0.5 ms
Lead Channel Sensing Intrinsic Amplitude: 2.8 mV
Lead Channel Sensing Intrinsic Amplitude: 6 mV
Lead Channel Setting Pacing Amplitude: 0.75 V
Lead Channel Setting Pacing Amplitude: 3.5 V
Lead Channel Setting Pacing Pulse Width: 0.5 ms
Lead Channel Setting Sensing Sensitivity: 4 mV
Pulse Gen Model: 2272
Pulse Gen Serial Number: 9159553

## 2019-06-05 NOTE — Progress Notes (Signed)
HPI [name] returns today for followup. She is a pleasant 63 yo woman with CHB, s/p PPM insertion about 3 months ago. She has done well in the interim except she notes that she gets pain in her left shoulder. She denies chest pain or sob. She wants to travel to [place] to visit family. No edema.  No Known Allergies   Current Outpatient Medications  Medication Sig Dispense Refill  . acetaminophen (TYLENOL) 325 MG tablet Take 1-2 tablets (325-650 mg total) by mouth every 6 (six) hours as needed for mild pain. 30 tablet 0  . albuterol (VENTOLIN HFA) 108 (90 Base) MCG/ACT inhaler Inhale 1 puff into the lungs every 4 (four) hours as needed for wheezing.    Marland Kitchen atenolol (TENORMIN) 25 MG tablet Take 25 mg by mouth as directed. 1 in the morning 1/2 in the evening    . cholecalciferol (VITAMIN D3) 25 MCG (1000 UT) tablet Take 1,000 Units by mouth daily.    . clindamycin (CLINDAGEL) 1 % gel Apply 1 application topically daily. X 30 days    . esomeprazole (NEXIUM) 40 MG capsule Take 40 mg by mouth daily.    . Omega-3 Fatty Acids (FISH OIL) 1000 MG CPDR Take 1,000 mg by mouth daily.    . pravastatin (PRAVACHOL) 20 MG tablet Take 20 mg by mouth daily.     No current facility-administered medications for this visit.      Past Medical History:  Diagnosis Date  . Allergy   . Hyperlipidemia   . Hypertension     ROS:   All systems reviewed and negative except as noted in the HPI.   Past Surgical History:  Procedure Laterality Date  . CHOLECYSTECTOMY  2004  . EYE SURGERY    . INNER EAR SURGERY  2004  . PACEMAKER IMPLANT N/A 03/05/2019   Procedure: PACEMAKER IMPLANT;  Surgeon: Evans Lance, MD;  Location: [facility];  Service: Cardiovascular;  Laterality: N/A;     Family History  Problem Relation Age of Onset  . Heart disease Father   . Diabetes Father      Social History   Socioeconomic History  . Marital status: Married    Spouse name: Not on file  . Number of  children: 3  . Years of education: Not on file  . Highest education level: Not on file  Occupational History  . Not on file  Social Needs  . Financial resource strain: Not on file  . Food insecurity    Worry: Not on file    Inability: Not on file  . Transportation needs    Medical: Not on file    Non-medical: Not on file  Tobacco Use  . Smoking status: Never Smoker  . Smokeless tobacco: Never Used  Substance and Sexual Activity  . Alcohol use: No  . Drug use: No  . Sexual activity: Not on file  Lifestyle  . Physical activity    Days per week: Not on file    Minutes per session: Not on file  . Stress: Not on file  Relationships  . Social Herbalist on phone: Not on file    Gets together: Not on file    Attends religious service: Not on file    Active member of club or organization: Not on file    Attends meetings of clubs or organizations: Not on file    Relationship status: Not on file  . Intimate partner  violence    Fear of current or ex partner: Not on file    Emotionally abused: Not on file    Physically abused: Not on file    Forced sexual activity: Not on file  Other Topics Concern  . Not on file  Social History Narrative  . Not on file     BP 118/78   Pulse 65   Ht 5\' 5"  (1.651 m)   Wt 156 lb (70.8 kg)   SpO2 98%   BMI 25.96 kg/m   Physical Exam:  Well appearing 63 yo woman, NAD HEENT: Unremarkable Neck:  No JVD, no thyromegally Lymphatics:  No adenopathy Back:  No CVA tenderness Lungs:  Clear with no wheezes HEART:  Regular rate rhythm, no murmurs, no rubs, no clicks Abd:  soft, positive bowel sounds, no organomegally, no rebound, no guarding Ext:  2 plus pulses, no edema, no cyanosis, no clubbing Skin:  No rashes no nodules Neuro:  CN II through XII intact, motor grossly intact  EKG - NSR with ventricular pacing  DEVICE  Normal device function.  See PaceArt for details.   Assess/Plan: 1. CHB - she conducts today but minimally  so with an AV delay of 350-400. We reprogrammed her device. 2. PPM - her St. Jude DDD PM is working normally. She will followup in 9 months. 3. Left shoulder pain - she will take tylenol.  76.D.

## 2019-06-05 NOTE — Patient Instructions (Addendum)
Medication Instructions:  Your physician recommends that you continue on your current medications as directed. Please refer to the Current Medication list given to you today.  Labwork: None ordered.  Testing/Procedures: None ordered.  Follow-Up: Your physician wants you to follow-up in: 9 months with Dr. Lovena Le.   You will receive a reminder letter in the mail two months in advance. If you don't receive a letter, please call our office to schedule the follow-up appointment.  Remote monitoring is used to monitor your Pacemaker from home. This monitoring reduces the number of office visits required to check your device to one time per year. It allows [place] to keep an eye on the functioning of your device to ensure it is working properly. You are scheduled for a device check from home on 09/16/2019. You may send your transmission at any time that day. If you have a wireless device, the transmission will be sent automatically. After your physician reviews your transmission, you will receive a postcard with your next transmission date.  Any Other Special Instructions Will Be Listed Below (If Applicable).  If you need a refill on your cardiac medications before your next appointment, please call your pharmacy.

## 2019-06-06 ENCOUNTER — Other Ambulatory Visit: Payer: Self-pay | Admitting: Internal Medicine

## 2019-06-06 DIAGNOSIS — N63 Unspecified lump in unspecified breast: Secondary | ICD-10-CM

## 2019-06-19 ENCOUNTER — Ambulatory Visit
Admission: RE | Admit: 2019-06-19 | Discharge: 2019-06-19 | Disposition: A | Discharge status: Home / Self Care | Payer: Medicare Other | Source: Ambulatory Visit | Attending: Internal Medicine | Admitting: Internal Medicine

## 2019-06-19 ENCOUNTER — Other Ambulatory Visit: Payer: Self-pay

## 2019-06-19 DIAGNOSIS — N63 Unspecified lump in unspecified breast: Secondary | ICD-10-CM

## 2019-06-20 ENCOUNTER — Ambulatory Visit (INDEPENDENT_AMBULATORY_CARE_PROVIDER_SITE_OTHER): Payer: Medicare Other | Admitting: Cardiology

## 2019-06-20 ENCOUNTER — Encounter: Payer: Self-pay | Admitting: Cardiology

## 2019-06-20 VITALS — BP 132/81 | HR 79 | Temp 97.7°F | Ht 65.0 in | Wt 158.4 lb

## 2019-06-20 DIAGNOSIS — I442 Atrioventricular block, complete: Secondary | ICD-10-CM | POA: Diagnosis not present

## 2019-06-20 DIAGNOSIS — Z950 Presence of cardiac pacemaker: Secondary | ICD-10-CM

## 2019-06-20 DIAGNOSIS — I10 Essential (primary) hypertension: Secondary | ICD-10-CM | POA: Diagnosis not present

## 2019-06-20 NOTE — Progress Notes (Signed)
Patient referred by Bonnita Nasuti, MD for dizziness, bradycardia  Subjective:   Michelle Moran, female    DOB: [birth year], 63 y.o.   MRN: [id number]   Chief Complaint  Patient presents with  . Hypertension     HPI   63 y.o. [place] female with hypertension, hyperlipidemia, prediabetes, GERD, now s/p dual chamber pacemaker for symptomatic third degree AV block.   She is doing well, does not have any angina/dyspnea symptoms. She has left shoulder pain, especially during cold weather. She has occasional palpitations. Previously atrial tachycardia was noted on pacemaker transmission.     Past Medical History:  Diagnosis Date  . Allergy   . Hyperlipidemia   . Hypertension      Past Surgical History:  Procedure Laterality Date  . CHOLECYSTECTOMY  2004  . EYE SURGERY    . INNER EAR SURGERY  2004  . PACEMAKER IMPLANT N/A 03/05/2019   Procedure: PACEMAKER IMPLANT;  Surgeon: Evans Lance, MD;  Location: [facility];  Service: Cardiovascular;  Laterality: N/A;     Social History   Socioeconomic History  . Marital status: Married    Spouse name: Not on file  . Number of children: 3  . Years of education: Not on file  . Highest education level: Not on file  Occupational History  . Not on file  Tobacco Use  . Smoking status: Never Smoker  . Smokeless tobacco: Never Used  Substance and Sexual Activity  . Alcohol use: No  . Drug use: No  . Sexual activity: Not on file  Other Topics Concern  . Not on file  Social History Narrative  . Not on file   Social Determinants of Health   Financial Resource Strain:   . Difficulty of Paying Living Expenses: Not on file  Food Insecurity:   . Worried About Charity fundraiser in the Last Year: Not on file  . Ran Out of Food in the Last Year: Not on file  Transportation Needs:   . Lack of Transportation (Medical): Not on file  . Lack of Transportation (Non-Medical): Not on file  Physical Activity:    . Days of Exercise per Week: Not on file  . Minutes of Exercise per Session: Not on file  Stress:   . Feeling of Stress : Not on file  Social Connections:   . Frequency of Communication with Friends and Family: Not on file  . Frequency of Social Gatherings with Friends and Family: Not on file  . Attends Religious Services: Not on file  . Active Member of Clubs or Organizations: Not on file  . Attends Archivist Meetings: Not on file  . Marital Status: Not on file  Intimate Partner Violence:   . Fear of Current or Ex-Partner: Not on file  . Emotionally Abused: Not on file  . Physically Abused: Not on file  . Sexually Abused: Not on file     Family History  Problem Relation Age of Onset  . Heart disease Father   . Diabetes Father      Current Outpatient Medications on File Prior to Visit  Medication Sig Dispense Refill  . acetaminophen (TYLENOL) 325 MG tablet Take 1-2 tablets (325-650 mg total) by mouth every 6 (six) hours as needed for mild pain. 30 tablet 0  . atenolol (TENORMIN) 25 MG tablet Take 25 mg by mouth as directed. 1 in the morning 1/2 in the evening    . cholecalciferol (VITAMIN  D3) 25 MCG (1000 UT) tablet Take 1,000 Units by mouth daily.    Marland Kitchen esomeprazole (NEXIUM) 40 MG capsule Take 40 mg by mouth as needed.     . Omega-3 Fatty Acids (FISH OIL) 1000 MG CPDR Take 1,000 mg by mouth daily.    . pravastatin (PRAVACHOL) 20 MG tablet Take 20 mg by mouth daily.     No current facility-administered medications on file prior to visit.    Cardiovascular studies:  03/05/2019 (Dr/. Cristopher Peru):  1. Successful implantation of a St. Jude dual-chamber pacemaker for symptomatic bradycardia due to complete heart block  2. No early apparent complications.      Echocardiogram 03/04/2019:  1. The left ventricle has hyperdynamic systolic function, with an ejection fraction of >65%. The cavity size was normal. There is mildly increased left ventricular wall thickness.  Left ventricular diastolic Doppler parameters are indeterminate.  2. The right ventricle has normal systolic function. The cavity was normal. There is no increase in right ventricular wall thickness.  3. Mild tricuspid regurgitation.  4. Trace tricuspid regurgitation. PASP 29 mmHg.  EKG 03/04/2019:  Sinus rhythm with third-degree AV block.   Junctional escape rhythm with underlying right bundle branch block, 42 bpm.   Left atrial enlargement.   Recent labs: 02/12/2019: Glucose 97.  BUN/creatinine 16/0.85.  EGFR 72.  Sodium 139, potassium 4.3.  AST 71, ALT 39. H/H 13.8/40.4.  MCV 83.  Platelets 309. Total cholesterol 213, TG 196, HDL 54, LDL 120 Hemoglobin A1c 6.1%. TSH 2.1 normal.  Free T4 1.25, mildly elevated.   Free T3 2.32, mildly reduced.   Review of Systems  Constitution: Negative for decreased appetite, malaise/fatigue, weight gain and weight loss.  HENT: Negative for congestion.   Eyes: Negative for visual disturbance.  Cardiovascular: Positive for palpitations (Occasional). Negative for chest pain, dyspnea on exertion, leg swelling and syncope.  Respiratory: Negative for cough.   Endocrine: Negative for cold intolerance.  Hematologic/Lymphatic: Does not bruise/bleed easily.  Skin: Negative for itching and rash.  Musculoskeletal: Negative for myalgias.  Gastrointestinal: Negative for abdominal pain, nausea and vomiting.  Genitourinary: Negative for dysuria.  Neurological: Negative for dizziness and weakness.  Psychiatric/Behavioral: The patient is not nervous/anxious.   All other systems reviewed and are negative.        Vitals:   06/20/19 1520  BP: 132/81  Pulse: 79  Temp: 97.7 F (36.5 C)  SpO2: 97%    Body mass index is 26.36 kg/m. Filed Weights   06/20/19 1520  Weight: 158 lb 6.4 oz (71.8 kg)     Objective:   Physical Exam  Constitutional: She is oriented to person, place, and time. She appears well-developed and well-nourished. No distress.   HENT:  Head: Normocephalic and atraumatic.  Eyes: Pupils are equal, round, and reactive to light. Conjunctivae are normal.  Neck: No JVD present.  Cardiovascular: Normal rate, regular rhythm and intact distal pulses.  No murmur heard. Pacemaker pocket scar, healing well.  Pulmonary/Chest: Effort normal and breath sounds normal. She has no wheezes. She has no rales.  Pacemaker scar with kelloid formation  Abdominal: Soft. Bowel sounds are normal. There is no rebound.  Musculoskeletal:        General: No edema.  Lymphadenopathy:    She has no cervical adenopathy.  Neurological: She is alert and oriented to person, place, and time. No cranial nerve deficit.  Skin: Skin is warm and dry.  Psychiatric: She has a normal mood and affect.  Nursing note and vitals  reviewed.         Assessment & Recommendations:   63 y.o. [place] female with hypertension, hyperlipidemia, prediabetes, GERD, now s/p dual chamber pacemaker for symptomatic third degree AV block.  Symptomatic third-degree AV block: S/p St Jude dual chamber pacemaker by Dr. Lovena Le 03/05/2019. Will monitor for any recurrence of atrial tachycardia. Encourage shoulder exercises to avoid frozen shoulder.  Hypertension:  Continue atenolol 25 mg in am, and as needed 12.5 mg in pm.  Hyperlipidemia: On pravastatin.  Follow-up in 6 months.   Nigel Mormon, MD [facility] [place] Pager: [phone] Office: [phone] If no answer Cell [phone]

## 2019-07-01 NOTE — Progress Notes (Signed)
PPM remote 

## 2019-07-07 ENCOUNTER — Other Ambulatory Visit: Payer: Self-pay | Admitting: Cardiology

## 2019-07-07 ENCOUNTER — Telehealth: Payer: Self-pay | Admitting: Cardiology

## 2019-07-07 DIAGNOSIS — I10 Essential (primary) hypertension: Secondary | ICD-10-CM

## 2019-07-07 MED ORDER — ATENOLOL 25 MG PO TABS: 25.0000 mg | ORAL_TABLET | Freq: Every day | ORAL | 3 refills | Status: DC

## 2019-07-07 NOTE — Telephone Encounter (Signed)
Refilled atenolol.  Thanks MJP

## 2019-07-07 NOTE — Telephone Encounter (Signed)
Atenolol refilled. 

## 2019-08-08 ENCOUNTER — Encounter: Payer: Self-pay | Admitting: Cardiology

## 2019-08-08 ENCOUNTER — Ambulatory Visit (INDEPENDENT_AMBULATORY_CARE_PROVIDER_SITE_OTHER): Payer: Medicare Other | Admitting: Cardiology

## 2019-08-08 ENCOUNTER — Other Ambulatory Visit: Payer: Self-pay

## 2019-08-08 VITALS — BP 126/87 | HR 73 | Temp 98.2°F

## 2019-08-08 DIAGNOSIS — Z950 Presence of cardiac pacemaker: Secondary | ICD-10-CM

## 2019-08-08 DIAGNOSIS — R0789 Other chest pain: Secondary | ICD-10-CM | POA: Diagnosis not present

## 2019-08-08 DIAGNOSIS — Z45018 Encounter for adjustment and management of other part of cardiac pacemaker: Secondary | ICD-10-CM

## 2019-08-08 DIAGNOSIS — I442 Atrioventricular block, complete: Secondary | ICD-10-CM | POA: Diagnosis not present

## 2019-08-08 DIAGNOSIS — E782 Mixed hyperlipidemia: Secondary | ICD-10-CM

## 2019-08-08 DIAGNOSIS — R079 Chest pain, unspecified: Secondary | ICD-10-CM | POA: Insufficient documentation

## 2019-08-08 HISTORY — DX: Encounter for adjustment and management of other part of cardiac pacemaker: Z45.018

## 2019-08-08 NOTE — Progress Notes (Signed)
   Follow up visit  Subjective:   Michelle Moran, female    DOB: 1955-10-10, 64 y.o.   MRN: [id number]   HPI  Chief Complaint  Patient presents with  . Chest Pain     64 y.o. [place] female with hypertension, hyperlipidemia, prediabetes, GERD, now s/p dual chamber pacemaker for symptomatic third degree AV block.   Patient complains of retrosternal chest pain radiating to her left arm. Curiously, pain is worse with certain physical movements.    Current Outpatient Medications on File Prior to Visit  Medication Sig Dispense Refill  . acetaminophen (TYLENOL) 325 MG tablet Take 1-2 tablets (325-650 mg total) by mouth every 6 (six) hours as needed for mild pain. 30 tablet 0  . atenolol (TENORMIN) 25 MG tablet Take 1 tablet (25 mg total) by mouth daily. May take additional 1/2 tab in the evening, in case of BP >140/80 mmHg. 90 tablet 3  . cholecalciferol (VITAMIN D3) 25 MCG (1000 UT) tablet Take 1,000 Units by mouth daily.    Marland Kitchen esomeprazole (NEXIUM) 40 MG capsule Take 40 mg by mouth as needed.     . Omega-3 Fatty Acids (FISH OIL) 1000 MG CPDR Take 1,000 mg by mouth daily.    . pravastatin (PRAVACHOL) 20 MG tablet Take 20 mg by mouth daily.     No current facility-administered medications on file prior to visit.    Cardiovascular & other pertient studies:  03/2019:  1. Successful implantation of a St. Jude dual-chamber pacemaker for symptomatic bradycardia due to complete heart block  2. No early apparent complications.   Recent labs: 03/04/2019: Glucose 177, BUN/Cr 9/0.88. EGFR >60. Na/K 138/3.9. Rest of the CMP normal H/H 13/40. MCV 85. Platelets 265 HbA1C 7.1%  2010: Chol 204, TG 180, HDL 59, LDL 109 TSH 1.1 normal   Review of Systems  Cardiovascular: Positive for chest pain. Negative for dyspnea on exertion, leg swelling, palpitations and syncope.        Vitals:   08/08/19 1334  BP: (!) 139/92  Pulse: 76  Temp: 98.2 F (36.8 C)     Objective:    Physical Exam  Constitutional: She appears well-developed and well-nourished.  Neck: No JVD present.  Cardiovascular: Normal rate, regular rhythm, normal heart sounds and intact distal pulses.  No murmur heard. Pulmonary/Chest: Effort normal and breath sounds normal. She has no wheezes. She has no rales.  Musculoskeletal:        General: No edema.  Nursing note and vitals reviewed.         Assessment & Recommendations:   64 y.o. [place] female with hypertension, hyperlipidemia, prediabetes, GERD, now s/p dual chamber pacemaker for symptomatic third degree AV block.   Third degree AV block: S/p pacemaker 03/2019. Normal functioning.  Scheduled  In office pacemaker check 08/08/19  Single (S)/Dual (D)/BV: D. Presenting AS-VP. Pacemaker dependant:  Y. Underlying Complete heart block AP 9.2%, VP 100%.  AMS Episodes 1- brief AT. AT/AF burden <1% . HVR 0 Longevity 10 Years. Magnet rate: >85%. Lead measurements: Stable. Histogram: Normal Patient activity: Good  Chest pain: Atypical. Paced rhythm at baseline.  I will obtain lexiscan nuclear stress test.  Hyperlipidemia: Check lipid panel. On Pravastatin.   Hypertension: Fairly well controlled.  Continue atenolol 25 mg daily, with additional dose of 12.5 mg if SBP>140 mmHg, DBP>90 mmHg.  Nigel Mormon, MD [facility] [place] Pager: [phone] Office: [phone]

## 2019-08-13 ENCOUNTER — Other Ambulatory Visit: Payer: Self-pay

## 2019-08-13 ENCOUNTER — Ambulatory Visit: Payer: Medicare Other

## 2019-08-13 DIAGNOSIS — R0789 Other chest pain: Secondary | ICD-10-CM

## 2019-08-14 ENCOUNTER — Telehealth: Payer: Self-pay | Admitting: Cardiology

## 2019-08-14 DIAGNOSIS — R9439 Abnormal result of other cardiovascular function study: Secondary | ICD-10-CM | POA: Insufficient documentation

## 2019-08-14 DIAGNOSIS — I208 Other forms of angina pectoris: Secondary | ICD-10-CM | POA: Insufficient documentation

## 2019-08-14 MED ORDER — ROSUVASTATIN CALCIUM 10 MG PO TABS: 10.0000 mg | ORAL_TABLET | Freq: Every day | ORAL | 3 refills | Status: DC

## 2019-08-14 MED ORDER — ISOSORBIDE MONONITRATE ER 30 MG PO TB24: 30.0000 mg | ORAL_TABLET | Freq: Every day | ORAL | 3 refills | Status: DC

## 2019-08-14 MED ORDER — ASPIRIN EC 81 MG PO TBEC: 81.0000 mg | DELAYED_RELEASE_TABLET | Freq: Every day | ORAL | 3 refills | Status: DC

## 2019-08-14 MED ORDER — NITROGLYCERIN 0.4 MG SL SUBL
0.4000 mg | SUBLINGUAL_TABLET | SUBLINGUAL | 3 refills | Status: DC | PRN
Start: 2019-08-14 — End: 2020-04-02

## 2019-08-14 NOTE — Telephone Encounter (Signed)
Lexiscan tetrofosmin nuclear stress test 08/13/2019:  Nondiagnostic ECG stress. Severe degree small extent perfusion defect consistent with apical thinning. Mild ischemia in this region cannot be completely excluded.  This defect could be seen in paced rhythm.  All segments of left ventricle demonstrated normal wall motion and thickening. Stress LV EF is hyperdynamic 84%.  No previous exam available for comparison. Low risk study.  Lipid panel 06/25/2019: (Checked by PCP) Chol 212, TG 260, HDL 58, LDL 102  Discussed above stress test results with the patient and her daughter-in-law Mellody Drown.  Patient is still reporting episodes of left-sided chest and arm pain with climbing up stairs.  Patient is planning to fly to [place] on Sunday 08/17/2019.  I recommend starting medical management for possible single-vessel coronary artery disease with stable angina , with aspirin, isosorbide mononitrate 30 mg daily, and as needed sublingual nitroglycerin.  Continue atenolol 25 mg daily.  Switch pravastatin to rosuvastatin 10 mg daily.  We also discussed diet modifications to reduce intake of saturated fat and carbohydrates.    I have encouraged the patient to monitor her symptoms.  If no improvement in symptoms, she may need coronary angiography and possible intervention.  Given that she will be in [place] for next couple months, this could be performed in [place] as necessary.  I will see her after her return from [place].   Elder Negus, MD [facility] [place] Pager: [phone] Office: [phone]

## 2019-12-04 ENCOUNTER — Telehealth: Payer: Self-pay | Admitting: Cardiology

## 2019-12-09 NOTE — Telephone Encounter (Signed)
Patient aware.

## 2019-12-19 ENCOUNTER — Ambulatory Visit: Payer: Medicaid Other | Admitting: Cardiology

## 2019-12-31 ENCOUNTER — Ambulatory Visit: Payer: Medicare Other | Admitting: Cardiology

## 2019-12-31 ENCOUNTER — Other Ambulatory Visit: Payer: Self-pay

## 2019-12-31 ENCOUNTER — Encounter: Payer: Self-pay | Admitting: Cardiology

## 2019-12-31 VITALS — BP 141/85 | HR 73 | Resp 17 | Ht 65.0 in | Wt 153.0 lb

## 2019-12-31 DIAGNOSIS — I208 Other forms of angina pectoris: Secondary | ICD-10-CM

## 2019-12-31 DIAGNOSIS — R9439 Abnormal result of other cardiovascular function study: Secondary | ICD-10-CM

## 2019-12-31 DIAGNOSIS — I10 Essential (primary) hypertension: Secondary | ICD-10-CM

## 2019-12-31 DIAGNOSIS — I442 Atrioventricular block, complete: Secondary | ICD-10-CM

## 2019-12-31 DIAGNOSIS — E782 Mixed hyperlipidemia: Secondary | ICD-10-CM

## 2019-12-31 MED ORDER — ROSUVASTATIN CALCIUM 10 MG PO TABS: 10.0000 mg | ORAL_TABLET | Freq: Every day | ORAL | 3 refills | Status: DC

## 2019-12-31 NOTE — Progress Notes (Signed)
Follow up visit  Subjective:   Michelle Moran, female    DOB: Mar 01, 1956, 64 y.o.   MRN: [id number]   HPI   Chief Complaint  Patient presents with  . AV BLOCK  . Follow-up    6 MONTH     64 y.o. [place] female with hypertension, hyperlipidemia, prediabetes, GERD, now s/p dual chamber pacemaker for symptomatic third degree AV block.   In February 2021, patient underwent stress testing due to complaints of atypical chest pain.  Results detailed below. I discussed above stress test results with the patient and her daughter-in-law Pricilla Handler.  Patient was planning to fly to [place] on Sunday 08/17/2019.  I recommended starting medical management for possible single-vessel coronary artery disease with stable angina , with aspirin, isosorbide mononitrate 30 mg daily, and as needed sublingual nitroglycerin.  Continued atenolol 25 mg daily.  Switched pravastatin to rosuvastatin 10 mg daily.  We also discussed diet modifications to reduce intake of saturated fat and carbohydrates.  I encouraged the patient to monitor her symptoms.  If no improvement in symptoms, she may need coronary angiography and possible intervention.  Given that she would be in [place] for next couple months, I stated that this could be performed in [place] as necessary.    While in [place], patient had severe tooth ache.  During this period, her blood pressure was severely elevated.  However, this is now improved.  She is back on taking atenolol 25 mg daily, along with pravastatin.  She also takes aspirin 81 mg daily.  Chest pain is only occasional.  She continues to have severe fluctuations in her blood pressure readings.  Current Outpatient Medications on File Prior to Visit  Medication Sig Dispense Refill  . acetaminophen (TYLENOL) 325 MG tablet Take 1-2 tablets (325-650 mg total) by mouth every 6 (six) hours as needed for mild pain. 30 tablet 0  . aspirin EC 81 MG tablet Take 1 tablet (81 mg total) by mouth daily.  90 tablet 3  . atenolol (TENORMIN) 25 MG tablet Take 1 tablet (25 mg total) by mouth daily. May take additional 1/2 tab in the evening, in case of BP >140/80 mmHg. 90 tablet 3  . cholecalciferol (VITAMIN D3) 25 MCG (1000 UT) tablet Take 1,000 Units by mouth daily.    . clindamycin (CLINDAGEL) 1 % gel     . esomeprazole (NEXIUM) 40 MG capsule Take 40 mg by mouth as needed.     . Omega-3 Fatty Acids (FISH OIL) 1000 MG CPDR Take 1,000 mg by mouth daily.    . pravastatin (PRAVACHOL) 20 MG tablet Take 20 mg by mouth daily.    . isosorbide mononitrate (IMDUR) 30 MG 24 hr tablet Take 1 tablet (30 mg total) by mouth daily. (Patient not taking: Reported on 12/31/2019) 90 tablet 3  . nitroGLYCERIN (NITROSTAT) 0.4 MG SL tablet Place 1 tablet (0.4 mg total) under the tongue every 5 (five) minutes as needed for chest pain. (Patient not taking: Reported on 12/31/2019) 30 tablet 3   No current facility-administered medications on file prior to visit.    Cardiovascular & other pertient studies:  EKG 12/31/2019: Sinus rhythm with ventricularly pacing  Scheduled Remote pacemaker check  12/01/2019: There were 10 atrial high rate episodes detected. The longest lasted 00:00:13:12 in duration. Review of available EGMs demonstrates paroxysmal atrial tachycardia. There was a <1 % cumulative atrial arrhythmia burden. No VHR episodes. Health trends do not demonstrate significant abnormality. Battery longevity is 9.8 years. RA pacing  is 11.0 %, RV pacing is >99.0 %.  Lexiscan Tetrofosmin Stress Test  08/13/2019: Nondiagnostic ECG stress. Severe degree small extent perfusion defect consistent with apical thinning. Mild ischemia in this region cannot be completely excluded.  This defect could be seen in paced rhythm.  All segments of left ventricle demonstrated normal wall motion and thickening. Stress LV EF is hyperdynamic 84%.  No previous exam available for comparison. Low risk study.  03/2019:  1. Successful  implantation of a St. Jude dual-chamber pacemaker for symptomatic bradycardia due to complete heart block  2. No early apparent complications.   Recent labs: 03/04/2019: Glucose 177, BUN/Cr 9/0.88. EGFR >60. Na/K 138/3.9. Rest of the CMP normal H/H 13/40. MCV 85. Platelets 265 HbA1C 7.1%  2010: Chol 204, TG 180, HDL 59, LDL 109 TSH 1.1 normal   Review of Systems  Cardiovascular: Positive for chest pain. Negative for dyspnea on exertion, leg swelling, palpitations and syncope.        Vitals:   12/31/19 1424  BP: (!) 141/85  Pulse: 73  Resp: 17  SpO2: 98%     Objective:   Physical Exam Vitals and nursing note reviewed.  Constitutional:      Appearance: She is well-developed.  Neck:     Vascular: No JVD.  Cardiovascular:     Rate and Rhythm: Normal rate and regular rhythm.     Pulses: Intact distal pulses.     Heart sounds: Normal heart sounds. No murmur heard.   Pulmonary:     Effort: Pulmonary effort is normal.     Breath sounds: Normal breath sounds. No wheezing or rales.           Assessment & Recommendations:   64 y.o. [place] female with hypertension, hyperlipidemia, prediabetes, GERD, now s/p dual chamber pacemaker for symptomatic third degree AV block.   Third degree AV block: S/p pacemaker 03/2019. Normal functioning.  Chest pain: Atypical. Paced rhythm at baseline.  Mild abnormality on stress test. Continue aspirin 81, switch pravastatin to rosuvastatin, continue atenolol, as needed sublingual nitroglycerin. In absence of severe recurrent chest pain, will hold off cardiac catheterization at this time.  Hyperlipidemia: LDL 94, triglyceride 268.  Switch pravastatin to rosuvastatin.  Repeat lipid panel in 3 months.  Hypertension:  Labile control.  Recommend remote patient monitoring.  If blood pressure consistently remains elevated, will add amlodipine 5 mg daily.    Follow-up in 3 months  Dinora Hemm Esther Hardy, MD [facility]  [place] Pager: [phone] Office: [phone]

## 2020-01-19 ENCOUNTER — Ambulatory Visit: Payer: Medicare Other | Admitting: Cardiology

## 2020-01-31 LAB — LIPID PANEL
Chol/HDL Ratio: 2.8 ratio (ref 0.0–4.4)
Cholesterol, Total: 171 mg/dL (ref 100–199)
HDL: 61 mg/dL (ref >39)
LDL Chol Calc (NIH): 87 mg/dL (ref 0–99)
Triglycerides: 130 mg/dL (ref 0–149)
VLDL Cholesterol Cal: 23 mg/dL (ref 5–40)

## 2020-03-31 ENCOUNTER — Ambulatory Visit: Payer: Medicare Other | Admitting: Cardiology

## 2020-04-02 ENCOUNTER — Ambulatory Visit: Payer: Medicare Other | Admitting: Cardiology

## 2020-04-02 ENCOUNTER — Encounter: Payer: Self-pay | Admitting: Cardiology

## 2020-04-02 ENCOUNTER — Other Ambulatory Visit: Payer: Self-pay

## 2020-04-02 DIAGNOSIS — I208 Other forms of angina pectoris: Secondary | ICD-10-CM

## 2020-04-02 DIAGNOSIS — R9439 Abnormal result of other cardiovascular function study: Secondary | ICD-10-CM

## 2020-04-02 DIAGNOSIS — E782 Mixed hyperlipidemia: Secondary | ICD-10-CM

## 2020-04-02 DIAGNOSIS — I10 Essential (primary) hypertension: Secondary | ICD-10-CM

## 2020-04-02 MED ORDER — NITROGLYCERIN 0.4 MG SL SUBL: 0.4000 mg | SUBLINGUAL_TABLET | SUBLINGUAL | 3 refills | Status: DC | PRN

## 2020-04-02 MED ORDER — ATENOLOL 25 MG PO TABS: 25.0000 mg | ORAL_TABLET | Freq: Every day | ORAL | 3 refills | Status: DC

## 2020-04-02 MED ORDER — ROSUVASTATIN CALCIUM 10 MG PO TABS: 10.0000 mg | ORAL_TABLET | Freq: Every day | ORAL | 3 refills | Status: DC

## 2020-04-02 NOTE — Progress Notes (Signed)
Follow up visit  Subjective:   Michelle Moran, female    DOB: [birth year], 64 y.o.   MRN: [id number]   HPI   Chief Complaint  Patient presents with   Hypertension   Follow-up    3 MONTH     64 y.o. Asian [place] female with hypertension, hyperlipidemia, prediabetes, GERD, now s/p dual chamber pacemaker for symptomatic third degree AV block.   Patient is doing well.  Blood pressure is 130/85 mmHg, avg HR 81 bpm.She stays active, looking after her grandchild. She walks regularly, without any complaint of exertional pain. She has has mild exertion dyspnea. She notices pain around her pacemaker site and left arm, with certain movements, only occasionally. This pain improves with tylenol.  Reviewed recent pacemaker interrogation and labs with the patient. Details below.    Current Outpatient Medications on File Prior to Visit  Medication Sig Dispense Refill   acetaminophen (TYLENOL) 325 MG tablet Take 1-2 tablets (325-650 mg total) by mouth every 6 (six) hours as needed for mild pain. 30 tablet 0   aspirin EC 81 MG tablet Take 1 tablet (81 mg total) by mouth daily. 90 tablet 3   atenolol (TENORMIN) 25 MG tablet Take 1 tablet (25 mg total) by mouth daily. May take additional 1/2 tab in the evening, in case of BP >140/80 mmHg. 90 tablet 3   cholecalciferol (VITAMIN D3) 25 MCG (1000 UT) tablet Take 1,000 Units by mouth daily.     clindamycin (CLINDAGEL) 1 % gel      esomeprazole (NEXIUM) 40 MG capsule Take 40 mg by mouth as needed.      nitroGLYCERIN (NITROSTAT) 0.4 MG SL tablet Place 1 tablet (0.4 mg total) under the tongue every 5 (five) minutes as needed for chest pain. (Patient not taking: Reported on 12/31/2019) 30 tablet 3   Omega-3 Fatty Acids (FISH OIL) 1000 MG CPDR Take 1,000 mg by mouth daily.     rosuvastatin (CRESTOR) 10 MG tablet Take 1 tablet (10 mg total) by mouth daily. 90 tablet 3   No current facility-administered medications on file prior to  visit.    Cardiovascular & other pertient studies:  Scheduled Remote pacemaker check 03/07/2020: There were 02 atrial high rate episodes detected. The longest lasted <1 min in duration. Review of available EGMs demonstrates paroxysmal atrial tachycardia. There was a <1 % cumulative atrial arrhythmia burden. No VHR episodes. Health trends do not demonstrate significant abnormality. Battery longevity is 9.5 years. RA pacing is 16.0 %, RV pacing is 99.0 %. Overall no change from 12/03/2019.     EKG 12/31/2019: Sinus rhythm with ventricularly pacing  Scheduled Remote pacemaker check  12/01/2019: There were 10 atrial high rate episodes detected. The longest lasted 00:00:13:12 in duration. Review of available EGMs demonstrates paroxysmal atrial tachycardia. There was a <1 % cumulative atrial arrhythmia burden. No VHR episodes. Health trends do not demonstrate significant abnormality. Battery longevity is 9.8 years. RA pacing is 11.0 %, RV pacing is >99.0 %.  Lexiscan Tetrofosmin Stress Test  08/13/2019: Nondiagnostic ECG stress. Severe degree small extent perfusion defect consistent with apical thinning. Mild ischemia in this region cannot be completely excluded.  This defect could be seen in paced rhythm.  All segments of left ventricle demonstrated normal wall motion and thickening. Stress LV EF is hyperdynamic 84%.  No previous exam available for comparison. Low risk study.  03/2019:  1. Successful implantation of a St. Jude dual-chamber pacemaker for symptomatic bradycardia due to complete heart block  2. No early apparent complications.   Recent labs: 01/30/2020: Chol 171, TG 130, HDL 61, LDL 87  03/04/2019: Glucose 177, BUN/Cr 9/0.88. EGFR >60. Na/K 138/3.9. Rest of the CMP normal H/H 13/40. MCV 85. Platelets 265 HbA1C 7.1%  2010: Chol 204, TG 180, HDL 59, LDL 109 TSH 1.1 normal   Review of Systems  Cardiovascular: Positive for chest pain. Negative for dyspnea on exertion, leg  swelling, palpitations and syncope.        Vitals:   04/02/20 1414  BP: (!) 142/88  Pulse: 69  Resp: 16  SpO2: 99%     Objective:   Physical Exam Vitals and nursing note reviewed.  Constitutional:      Appearance: She is well-developed.  Neck:     Vascular: No JVD.  Cardiovascular:     Rate and Rhythm: Normal rate and regular rhythm.     Pulses: Intact distal pulses.     Heart sounds: Normal heart sounds. No murmur heard.   Pulmonary:     Effort: Pulmonary effort is normal.     Breath sounds: Normal breath sounds. No wheezing or rales.           Assessment & Recommendations:   64 y.o. Asian [place] female with hypertension, hyperlipidemia, prediabetes, GERD, now s/p dual chamber pacemaker for symptomatic third degree AV block.   Third degree AV block: S/p pacemaker 03/2019. Normal functioning.  Chest pain: Atypical, likely musculoskeletal. Will give SL NTG for as needed use.   Hyperlipidemia: Improved on Crestor.  Hypertension: Controlled Meds refilled  F/u in 6 months  [place], MD [facility] [place] Pager: [phone] Office: [phone]

## 2020-06-01 ENCOUNTER — Other Ambulatory Visit: Payer: Self-pay | Admitting: Internal Medicine

## 2020-06-01 DIAGNOSIS — Z1231 Encounter for screening mammogram for malignant neoplasm of breast: Secondary | ICD-10-CM

## 2020-06-30 ENCOUNTER — Ambulatory Visit: Payer: Medicare Other | Attending: Orthopedic Surgery

## 2020-06-30 ENCOUNTER — Other Ambulatory Visit: Payer: Self-pay

## 2020-06-30 DIAGNOSIS — M62830 Muscle spasm of back: Secondary | ICD-10-CM

## 2020-06-30 DIAGNOSIS — G8929 Other chronic pain: Secondary | ICD-10-CM | POA: Diagnosis present

## 2020-06-30 DIAGNOSIS — R293 Abnormal posture: Secondary | ICD-10-CM | POA: Diagnosis present

## 2020-06-30 DIAGNOSIS — M2560 Stiffness of unspecified joint, not elsewhere classified: Secondary | ICD-10-CM

## 2020-06-30 DIAGNOSIS — M545 Low back pain, unspecified: Secondary | ICD-10-CM | POA: Diagnosis present

## 2020-06-30 NOTE — Patient Instructions (Signed)
SKC , LTR , PPT x 3-10 reps hold 5-15 sec x 2/day

## 2020-06-30 NOTE — Therapy (Signed)
[facility] Outpatient Rehabilitation [facility] [address] [place], [place], [address] Phone: [phone]   Fax:  [phone]  Physical Therapy Evaluation  Patient Details  Name: Michelle Moran MRN: [id number] Date of Birth: 1956/03/31 Referring Provider (PT): Teryl Lucy, MD   Encounter Date: 06/30/2020   PT End of Session - 06/30/20 1551    Visit Number 1    Number of Visits 12    Date for PT Re-Evaluation 08/13/20    Authorization Type [facility]    PT Start Time 0415    PT Stop Time 0500    PT Time Calculation (min) 45 min    Activity Tolerance Patient tolerated treatment well;Patient limited by pain    Behavior During Therapy [facility] for tasks assessed/performed           Past Medical History:  Diagnosis Date  . Allergy   . Encounter for care of pacemaker 08/08/2019  . Hyperlipidemia   . Hypertension   . Pacemaker St Jude dual chamber MRI Assurity Dr-Rf - [id number] on 03/05/19 03/05/2019  . Third degree AV block (HCC) 03/04/2019    Past Surgical History:  Procedure Laterality Date  . CHOLECYSTECTOMY  2004  . EYE SURGERY    . INNER EAR SURGERY  2004  . PACEMAKER IMPLANT N/A 03/05/2019   Procedure: PACEMAKER IMPLANT;  Surgeon: Marinus Maw, MD;  Location: [facility] INVASIVE CV LAB;  Service: Cardiovascular;  Laterality: N/A;    There were no vitals filed for this visit.    Subjective Assessment - 06/30/20 1627    Subjective She reports onset in 2015. She reports she was lifting and was having pain with this.  Chiropractor  every year 3-5visits  with min benefit.    Pertinent History pacemaker,  RT and Lt side injection    Limitations House hold activities   reaching overhead nad lifting/   How long can you sit comfortably? 30 min    How long can you walk comfortably? walking stairs painful    Currently in Pain? Yes    Pain Location Back    Pain Orientation Right    Pain Radiating Towards with pain in RT buttock into posterior thigh and leg     Pain Onset More than a month ago    Pain Frequency Constant    Aggravating Factors  leg exercise , bending, house work    Pain Relieving Factors tylenol, cold.              [facility] PT Assessment - 06/30/20 0001      Assessment   Medical Diagnosis LBP    Referring Provider (PT) Teryl Lucy, MD    Onset Date/Surgical Date --   2015   Next MD Visit 07/07/20    Prior Therapy None. chiropractor x 4 with no benefit.      Precautions   Precautions None      Restrictions   Weight Bearing Restrictions No      Balance Screen   Has the patient fallen in the past 6 months No      Prior Function   Level of Independence Independent    Vocation Retired      IT consultant   Overall Cognitive Status Within Functional Limits for tasks assessed      Observation/Other Assessments   Focus on Therapeutic Outcomes (FOTO)  38% with possible improvement to 60%      ROM / Strength   AROM / PROM / Strength AROM;PROM;Strength      AROM  AROM Assessment Site Lumbar    Lumbar Flexion 45   incr pain   Lumbar Extension 20   incr pain   Lumbar - Right Side Bend 15   incr pain   Lumbar - Left Side Bend 15   increased pain   Lumbar - Right Rotation WNL   incr pain   Lumbar - Left Rotation WNL      PROM   Overall PROM Comments Bilateral hips WNL      Strength   Overall Strength Comments Unable to get her to hold for any of the testing.      Flexibility   Soft Tissue Assessment /Muscle Length yes    Hamstrings equal and more pain on RT    Quadriceps WNL    Piriformis WNL      Palpation   Palpation comment Tender RT SI area and gluteasl , general tenderness from mid thoracic spine to sacrum.      Ambulation/Gait   Gait Comments WNL                      Objective measurements completed on examination: See above findings.               PT Education - 06/30/20 1559    Education Details POC, HEP, FOTO score and possible improvement    Person(s) Educated Patient     Methods Explanation;Demonstration;Tactile cues;Verbal cues;Handout    Comprehension Verbalized understanding;Returned demonstration            PT Short Term Goals - 06/30/20 1554      PT SHORT TERM GOAL #1   Title She will be indpendent with initial hEP.    Time 3    Period Weeks    Status New      PT SHORT TERM GOAL #2   Title She will report pain decreased 25% or more    Time 3    Period Weeks    Status New      PT SHORT TERM GOAL #3   Title FOTO improved to  50%    Time 3    Period Weeks    Status New      PT SHORT TERM GOAL #4   Title She will be able to flex 60 degrees before incr pain    Time 3    Period Weeks    Status New             PT Long Term Goals - 06/30/20 1555      PT LONG TERM GOAL #1   Title She will be independent with all hEp issued    Time 6    Period Weeks    Status New      PT LONG TERM GOAL #2   Title She will report her pain as  intermittant and decreased 50% or more    Time 6    Period Weeks    Status New      PT LONG TERM GOAL #3   Title FOTO improved to  60%    Time 6    Period Weeks    Status New      PT LONG TERM GOAL #4   Title She will report able to done normal home tasks with min pain.    Time 6    Period Weeks    Status New      PT LONG TERM GOAL #5   Title She will be able to  sit fot 45-60 min before increased pain    Time 6    Period Weeks    Status New                  Plan - 06/30/20 1552    Clinical Impression Statement Michelle Moran presents with chronic RT sided LBP with milder LT side pain. She indicates the RT SI is area of most pain but also has pain into posterior RT thigh and leg.  Sheis limited with bending and lifting  for home tasks.  There may have been a lanquage disconnect a sshe did not give effort with all MMT. All LE  ROM was WNL . Heer general movements with trsndfer and transitional movements except getting out of chair were normal.   She may benefit from skilled PT and consistent  HEP. She may have more of an SI problem than lumbar  but   this was not clear..    Personal Factors and Comorbidities Age;Time since onset of injury/illness/exacerbation;Past/Current Experience;Comorbidity 1    Comorbidities pacemaker    Examination-Activity Limitations Sit;Carry;Lift;Stairs    Examination-Participation Restrictions Cleaning;Meal Prep;Community Activity;Laundry    Stability/Clinical Decision Making Stable/Uncomplicated    Clinical Decision Making Moderate    Rehab Potential Good    PT Frequency 2x / week    PT Duration 6 weeks    PT Treatment/Interventions Passive range of motion;Dry needling;Manual techniques;Patient/family education;Therapeutic exercise;Therapeutic activities;Moist Heat    PT Next Visit Plan review HEp and progress as able  manual and core sttrength,    NO Electric Stim  due to PACEMAKER   Pt speaks Hindi as language of choice so may need interpreter if instructions are not clear to her    PT Home Exercise Plan PPT , SKC, LTR    Consulted and Agree with Plan of Care Patient           Patient will benefit from skilled therapeutic intervention in order to improve the following deficits and impairments:  Pain,Postural dysfunction,Decreased activity tolerance,Increased muscle spasms,Decreased range of motion,Decreased strength  Visit Diagnosis: Joint stiffness of spine - Plan: PT plan of care cert/re-cert  Chronic bilateral low back pain, unspecified whether sciatica present - Plan: PT plan of care cert/re-cert  Muscle spasm of back - Plan: PT plan of care cert/re-cert  Abnormal posture     Problem List Patient Active Problem List   Diagnosis Date Noted  . Stable angina (HCC) 08/14/2019  . Abnormal stress test 08/14/2019  . Encounter for care of pacemaker 08/08/2019  . Atypical chest pain 08/08/2019  . Pacemaker St Jude dual chamber MRI Assurity Dr-Rf - [id number] on 03/05/19 03/05/2019  . Third degree AV block (HCC) 03/04/2019  . Bradycardia  03/04/2019  . Dizziness 03/04/2019  . Syncope and collapse 03/04/2019  . Exertional dyspnea 03/04/2019  . Mixed hyperlipidemia 03/04/2019  . Essential hypertension 03/04/2019    Caprice Red  PT 06/30/2020, 5:31 PM  [facility] [address] [place], [place], [address] Phone: [phone]   Fax:  [phone]  Name: Michelle Moran MRN: [id number] Date of Birth: Nov 29, 1955

## 2020-07-20 ENCOUNTER — Ambulatory Visit: Payer: Medicare Other

## 2020-07-21 ENCOUNTER — Other Ambulatory Visit: Payer: Self-pay

## 2020-07-21 ENCOUNTER — Ambulatory Visit: Payer: Medicare Other | Attending: Orthopedic Surgery

## 2020-07-21 ENCOUNTER — Ambulatory Visit
Admission: RE | Admit: 2020-07-21 | Discharge: 2020-07-21 | Disposition: A | Discharge status: Home / Self Care | Payer: Medicare Other | Source: Ambulatory Visit | Attending: Internal Medicine | Admitting: Internal Medicine

## 2020-07-21 DIAGNOSIS — G8929 Other chronic pain: Secondary | ICD-10-CM | POA: Insufficient documentation

## 2020-07-21 DIAGNOSIS — R293 Abnormal posture: Secondary | ICD-10-CM

## 2020-07-21 DIAGNOSIS — M545 Low back pain, unspecified: Secondary | ICD-10-CM | POA: Diagnosis not present

## 2020-07-21 DIAGNOSIS — M62830 Muscle spasm of back: Secondary | ICD-10-CM | POA: Diagnosis present

## 2020-07-21 DIAGNOSIS — M2560 Stiffness of unspecified joint, not elsewhere classified: Secondary | ICD-10-CM | POA: Diagnosis present

## 2020-07-21 DIAGNOSIS — Z1231 Encounter for screening mammogram for malignant neoplasm of breast: Secondary | ICD-10-CM

## 2020-07-21 NOTE — Therapy (Addendum)
[facility] Outpatient Rehabilitation [facility] [address] [place], [place], [address] Phone: [phone]   Fax:  [phone]  Physical Therapy Treatment  Patient Details  Name: Michelle Moran MRN: [id number] Date of Birth: June 19, 1956 Referring Provider (PT): Teryl Lucy, MD   Encounter Date: 07/21/2020   PT End of Session - 07/21/20 1411    Visit Number 2    Number of Visits 12    Date for PT Re-Evaluation 08/13/20    Authorization Type [facility]    PT Start Time 1334    PT Stop Time 1415    PT Time Calculation (min) 41 min    Activity Tolerance Patient tolerated treatment well    Behavior During Therapy [facility] for tasks assessed/performed           Past Medical History:  Diagnosis Date  . Allergy   . Encounter for care of pacemaker 08/08/2019  . Hyperlipidemia   . Hypertension   . Pacemaker St Jude dual chamber MRI Assurity Dr-Rf - [id number] on 03/05/19 03/05/2019  . Third degree AV block (HCC) 03/04/2019    Past Surgical History:  Procedure Laterality Date  . CHOLECYSTECTOMY  2004  . EYE SURGERY    . INNER EAR SURGERY  2004  . PACEMAKER IMPLANT N/A 03/05/2019   Procedure: PACEMAKER IMPLANT;  Surgeon: Marinus Maw, MD;  Location: [facility] INVASIVE CV LAB;  Service: Cardiovascular;  Laterality: N/A;    There were no vitals filed for this visit.   Subjective Assessment - 07/21/20 1336    Subjective She reports her back pain is a little better since initial evaluation and has been doing her exercises daily. She has noticed some swelling in her Rt knee over the past couple days.    Currently in Pain? Yes    Pain Score 6     Pain Location Back    Pain Orientation Right    Pain Type Chronic pain    Pain Onset More than a month ago    Pain Frequency Constant              [facility] PT Assessment - 07/21/20 0001      AROM   Lumbar Flexion 50 pain   pain   Lumbar Extension WFL pain free    Lumbar - Right Side Bend 15 pain    Lumbar - Left Side  Bend WFL pain free    Lumbar - Right Rotation WFL pain free    Lumbar - Left Rotation WFL pain free      Palpation   Palpation comment Rt posterior rotated innoninate      Special Tests   Other special tests (+) FABER Rt, (+) SI compression (+) Sacral Thrust (-) SLR                         [facility] Adult PT Treatment/Exercise - 07/21/20 0001      Self-Care   Self-Care Other Self-Care Comments    Other Self-Care Comments  see patient education      Lumbar Exercises: Stretches   Pelvic Tilt Limitations PPT 1 x10    Figure 4 Stretch Limitations 30 sec each supine    Other Lumbar Stretch Exercise hip flexor stretch 90 sec      Lumbar Exercises: Supine   Clam Limitations 2 x 10 green TB    Bridge Limitations 2  x10    Other Supine Lumbar Exercises LTR 1 min      Manual  Therapy   Manual therapy comments STM to Rt glutes, piriformis, QL, and lumbar paraspinals                  PT Education - 07/21/20 1419    Education Details updated HEP    Person(s) Educated Patient    Methods Explanation;Demonstration;Handout    Comprehension Verbalized understanding;Returned demonstration            PT Short Term Goals - 07/21/20 1415      PT SHORT TERM GOAL #1   Title She will be indpendent with initial hEP.    Baseline 07/21/20 cues for PPT    Time 3    Period Weeks    Status On-going      PT SHORT TERM GOAL #2   Title She will report pain decreased 25% or more    Time 3    Period Weeks    Status On-going      PT SHORT TERM GOAL #3   Title FOTO improved to  50%    Time 3    Period Weeks    Status Deferred      PT SHORT TERM GOAL #4   Title She will be able to flex 60 degrees before incr pain    Time 3    Period Weeks    Status On-going             PT Long Term Goals - 06/30/20 1555      PT LONG TERM GOAL #1   Title She will be independent with all hEp issued    Time 6    Period Weeks    Status New      PT LONG TERM GOAL #2   Title She  will report her pain as  intermittant and decreased 50% or more    Time 6    Period Weeks    Status New      PT LONG TERM GOAL #3   Title FOTO improved to  60%    Time 6    Period Weeks    Status New      PT LONG TERM GOAL #4   Title She will report able to done normal home tasks with min pain.    Time 6    Period Weeks    Status New      PT LONG TERM GOAL #5   Title She will be able to sit fot 45-60 min before increased pain    Time 6    Period Weeks    Status New                 Plan - 07/21/20 1354    Clinical Impression Statement Patient demonstrates Rt posterior rotated innominate that easily corrects with muscle energy technique at beginning session. Patient's signs/symptoms are potentially SI related as she has a postive FABER, Sacral Thrust, and SI joint compression test at today's session. Notable tautness and palpable tenderness about Rt piriformis and gluteal musculature with partial release from manual therapy.  Light progression of core/hip strengthening with patient requiring cues for proper performance of posterior pelvic tilt with ability to properly perform once cued.    Personal Factors and Comorbidities Age;Time since onset of injury/illness/exacerbation;Past/Current Experience;Comorbidity 1    Comorbidities pacemaker    Examination-Activity Limitations Sit;Carry;Lift;Stairs    Examination-Participation Restrictions Cleaning;Meal Prep;Community Activity;Laundry    Stability/Clinical Decision Making Stable/Uncomplicated    Rehab Potential Good    PT Frequency 2x / week  PT Duration 6 weeks    PT Treatment/Interventions Passive range of motion;Dry needling;Manual techniques;Patient/family education;Therapeutic exercise;Therapeutic activities;Moist Heat    PT Next Visit Plan manual and core sttrength,    NO Electric Stim  due to PACEMAKER   Pt speaks Hindi as language of choice so may need interpreter if instructions are not clear to her    PT Home  Exercise Plan PPT , SKC, LTR    Consulted and Agree with Plan of Care Patient           Patient will benefit from skilled therapeutic intervention in order to improve the following deficits and impairments:  Pain,Postural dysfunction,Decreased activity tolerance,Increased muscle spasms,Decreased range of motion,Decreased strength  Visit Diagnosis: Chronic bilateral low back pain, unspecified whether sciatica present  Muscle spasm of back  Abnormal posture  Joint stiffness of spine     Problem List Patient Active Problem List   Diagnosis Date Noted  . Stable angina (HCC) 08/14/2019  . Abnormal stress test 08/14/2019  . Encounter for care of pacemaker 08/08/2019  . Atypical chest pain 08/08/2019  . Pacemaker St Jude dual chamber MRI Assurity Dr-Rf - [id number] on 03/05/19 03/05/2019  . Third degree AV block (HCC) 03/04/2019  . Bradycardia 03/04/2019  . Dizziness 03/04/2019  . Syncope and collapse 03/04/2019  . Exertional dyspnea 03/04/2019  . Mixed hyperlipidemia 03/04/2019  . Essential hypertension 03/04/2019   Michelle Moran, PT, DPT, ATC 07/21/20 2:32 PM  [facility] Outpatient Rehabilitation [facility] [address] [place], [place], [address] Phone: [phone]   Fax:  [phone]  Name: Michelle Moran MRN: [id number] Date of Birth: 1956-04-15

## 2020-07-26 ENCOUNTER — Other Ambulatory Visit: Payer: Self-pay

## 2020-07-26 ENCOUNTER — Ambulatory Visit: Payer: Medicare Other

## 2020-07-26 DIAGNOSIS — M2560 Stiffness of unspecified joint, not elsewhere classified: Secondary | ICD-10-CM

## 2020-07-26 DIAGNOSIS — M545 Low back pain, unspecified: Secondary | ICD-10-CM

## 2020-07-26 DIAGNOSIS — R293 Abnormal posture: Secondary | ICD-10-CM

## 2020-07-26 DIAGNOSIS — G8929 Other chronic pain: Secondary | ICD-10-CM

## 2020-07-26 DIAGNOSIS — M62830 Muscle spasm of back: Secondary | ICD-10-CM

## 2020-07-26 NOTE — Therapy (Signed)
[facility] Outpatient Rehabilitation [facility] [address] [place], [place], [address] Phone: [phone]   Fax:  [phone]  Physical Therapy Treatment  Patient Details  Name: Michelle Moran MRN: [id number] Date of Birth: [birth year] Referring Provider (PT): Teryl Lucy, MD   Encounter Date: 07/26/2020   PT End of Session - 07/26/20 1442    Visit Number 3    Number of Visits 12    Date for PT Re-Evaluation 08/13/20    Authorization Type [facility]    PT Start Time 1418    PT Stop Time 1458    PT Time Calculation (min) 40 min    Activity Tolerance Patient tolerated treatment well    Behavior During Therapy [facility] for tasks assessed/performed           Past Medical History:  Diagnosis Date  . Allergy   . Encounter for care of pacemaker 08/08/2019  . Hyperlipidemia   . Hypertension   . Pacemaker St Jude dual chamber MRI Assurity Dr-Rf - [id number] on 03/05/19 03/05/2019  . Third degree AV block (HCC) 03/04/2019    Past Surgical History:  Procedure Laterality Date  . CHOLECYSTECTOMY  2004  . EYE SURGERY    . INNER EAR SURGERY  2004  . PACEMAKER IMPLANT N/A 03/05/2019   Procedure: PACEMAKER IMPLANT;  Surgeon: Marinus Maw, MD;  Location: [facility] INVASIVE CV LAB;  Service: Cardiovascular;  Laterality: N/A;    There were no vitals filed for this visit.   Subjective Assessment - 07/26/20 1420    Subjective Patient reports her back is little better. She denies any pain currenly and reports compliance with HEP. She denies any soreness after last session.    Currently in Pain? No/denies                             [facility] Adult PT Treatment/Exercise - 07/26/20 0001      Lumbar Exercises: Stretches   Lobbyist Limitations prone 90 sec each    Other Lumbar Stretch Exercise LTR with figure 4, 1 min each      Lumbar Exercises: Supine   AB Set Limitations attempted    Pelvic Tilt Limitations 2 x 10    Bridge Limitations 2 x 15       Manual Therapy   Manual therapy comments STM to Rt glutes, piriformis, QL, and lumbar paraspinals                    PT Short Term Goals - 07/21/20 1415      PT SHORT TERM GOAL #1   Title She will be indpendent with initial hEP.    Baseline 07/21/20 cues for PPT    Time 3    Period Weeks    Status On-going      PT SHORT TERM GOAL #2   Title She will report pain decreased 25% or more    Time 3    Period Weeks    Status On-going      PT SHORT TERM GOAL #3   Title FOTO improved to  50%    Time 3    Period Weeks    Status Deferred      PT SHORT TERM GOAL #4   Title She will be able to flex 60 degrees before incr pain    Time 3    Period Weeks    Status On-going  PT Long Term Goals - 06/30/20 1555      PT LONG TERM GOAL #1   Title She will be independent with all hEp issued    Time 6    Period Weeks    Status New      PT LONG TERM GOAL #2   Title She will report her pain as  intermittant and decreased 50% or more    Time 6    Period Weeks    Status New      PT LONG TERM GOAL #3   Title FOTO improved to  60%    Time 6    Period Weeks    Status New      PT LONG TERM GOAL #4   Title She will report able to done normal home tasks with min pain.    Time 6    Period Weeks    Status New      PT LONG TERM GOAL #5   Title She will be able to sit fot 45-60 min before increased pain    Time 6    Period Weeks    Status New                 Plan - 07/26/20 1420    Clinical Impression Statement Patient arrives with symmetrical pelvic alignment at beginning of session. Overall good tolerance to today's session with patient reporting muscular fatigue with hip and core strengthening. Patient requires heavy verbal and visual cues for proper performance of pelvic tilts with intermittent ability to properly perform with continued cueing. Patient has significant difficulty engaging transverse abdominis muscle requiring continued emphasis on core  stabilization at future sessions.    Personal Factors and Comorbidities Age;Time since onset of injury/illness/exacerbation;Past/Current Experience;Comorbidity 1    Comorbidities pacemaker    Examination-Activity Limitations Sit;Carry;Lift;Stairs    Examination-Participation Restrictions Cleaning;Meal Prep;Community Activity;Laundry    Stability/Clinical Decision Making Stable/Uncomplicated    Rehab Potential Good    PT Frequency 2x / week    PT Duration 6 weeks    PT Treatment/Interventions Passive range of motion;Dry needling;Manual techniques;Patient/family education;Therapeutic exercise;Therapeutic activities;Moist Heat    PT Next Visit Plan Update HEP.   Progress core and hip strength as toleratd. NO Electric Stim  due to PACEMAKER   Pt speaks Hindi as language of choice so may need interpreter if instructions are not clear to her    PT Home Exercise Plan Access Code [email or web address]    Consulted and Agree with Plan of Care Patient           Patient will benefit from skilled therapeutic intervention in order to improve the following deficits and impairments:  Pain,Postural dysfunction,Decreased activity tolerance,Increased muscle spasms,Decreased range of motion,Decreased strength  Visit Diagnosis: Chronic bilateral low back pain, unspecified whether sciatica present  Muscle spasm of back  Abnormal posture  Joint stiffness of spine     Problem List Patient Active Problem List   Diagnosis Date Noted  . Stable angina (HCC) 08/14/2019  . Abnormal stress test 08/14/2019  . Encounter for care of pacemaker 08/08/2019  . Atypical chest pain 08/08/2019  . Pacemaker St Jude dual chamber MRI Assurity Dr-Rf - [id number] on 03/05/19 03/05/2019  . Third degree AV block (HCC) 03/04/2019  . Bradycardia 03/04/2019  . Dizziness 03/04/2019  . Syncope and collapse 03/04/2019  . Exertional dyspnea 03/04/2019  . Mixed hyperlipidemia 03/04/2019  . Essential hypertension 03/04/2019   [name],  PT, DPT, ATC 07/26/20 3:02 PM  [place]  Outpatient Rehabilitation [facility] [address] [place], [place], [address] Phone: [phone]   Fax:  [phone]  Name: Michelle Moran MRN: [id number] Date of Birth: June 14, 1956

## 2020-07-28 ENCOUNTER — Other Ambulatory Visit: Payer: Self-pay

## 2020-07-28 ENCOUNTER — Ambulatory Visit: Payer: Medicare Other

## 2020-07-28 DIAGNOSIS — M545 Low back pain, unspecified: Secondary | ICD-10-CM | POA: Diagnosis not present

## 2020-07-28 DIAGNOSIS — M2560 Stiffness of unspecified joint, not elsewhere classified: Secondary | ICD-10-CM

## 2020-07-28 DIAGNOSIS — G8929 Other chronic pain: Secondary | ICD-10-CM

## 2020-07-28 DIAGNOSIS — R293 Abnormal posture: Secondary | ICD-10-CM

## 2020-07-28 DIAGNOSIS — M62830 Muscle spasm of back: Secondary | ICD-10-CM

## 2020-07-28 NOTE — Therapy (Signed)
[facility] Outpatient Rehabilitation [facility] [address] [place], [place], [address] Phone: [phone]   Fax:  [phone]  Physical Therapy Treatment  Patient Details  Name: Michelle Moran MRN: [id number] Date of Birth: [birth year] Referring Provider (PT): Teryl Lucy, MD   Encounter Date: 07/28/2020   PT End of Session - 07/28/20 1601    Visit Number 4    Number of Visits 12    Date for PT Re-Evaluation 08/13/20    Authorization Type [facility]    PT Start Time 1550    PT Stop Time 1630    PT Time Calculation (min) 40 min    Activity Tolerance Patient tolerated treatment well    Behavior During Therapy [facility] for tasks assessed/performed           Past Medical History:  Diagnosis Date  . Allergy   . Encounter for care of pacemaker 08/08/2019  . Hyperlipidemia   . Hypertension   . Pacemaker St Jude dual chamber MRI Assurity Dr-Rf - [id number] on 03/05/19 03/05/2019  . Third degree AV block (HCC) 03/04/2019    Past Surgical History:  Procedure Laterality Date  . CHOLECYSTECTOMY  2004  . EYE SURGERY    . INNER EAR SURGERY  2004  . PACEMAKER IMPLANT N/A 03/05/2019   Procedure: PACEMAKER IMPLANT;  Surgeon: Marinus Maw, MD;  Location: [facility] INVASIVE CV LAB;  Service: Cardiovascular;  Laterality: N/A;    There were no vitals filed for this visit.   Subjective Assessment - 07/28/20 1551    Subjective Patient reports her back is better and denies pain currently. She reports bending and fast walking increase her pain. She reports compliance with HEP.    Pertinent History pacemaker,  RT and Lt side injection    Currently in Pain? No/denies              [facility] PT Assessment - 07/28/20 0001      AROM   Lumbar Flexion 70 pain at end range                         [facility] Adult PT Treatment/Exercise - 07/28/20 0001      Lumbar Exercises: Stretches   Figure 4 Stretch Limitations 60 sec each supine      Lumbar Exercises: Seated    Other Seated Lumbar Exercises pelvic tilt 1 x 10      Lumbar Exercises: Supine   AB Set Limitations 1 x 10    Pelvic Tilt Limitations 2 x 10    Bent Knee Raise Limitations 2 x 10      Lumbar Exercises: Sidelying   Hip Abduction Limitations 2 x 10      Manual Therapy   Manual therapy comments STM to Rt glutes, piriformis, QL, and lumbar paraspinals                    PT Short Term Goals - 07/28/20 1606      PT SHORT TERM GOAL #1   Title She will be indpendent with initial hEP.    Time 3    Period Weeks    Status Achieved      PT SHORT TERM GOAL #2   Title She will report pain decreased 25% or more    Time 3    Period Weeks    Status On-going      PT SHORT TERM GOAL #3   Title FOTO improved to  50%  Time 3    Period Weeks    Status Deferred      PT SHORT TERM GOAL #4   Title She will be able to flex 60 degrees before incr pain    Time 3    Period Weeks    Status Achieved             PT Long Term Goals - 06/30/20 1555      PT LONG TERM GOAL #1   Title She will be independent with all hEp issued    Time 6    Period Weeks    Status New      PT LONG TERM GOAL #2   Title She will report her pain as  intermittant and decreased 50% or more    Time 6    Period Weeks    Status New      PT LONG TERM GOAL #3   Title FOTO improved to  60%    Time 6    Period Weeks    Status New      PT LONG TERM GOAL #4   Title She will report able to done normal home tasks with min pain.    Time 6    Period Weeks    Status New      PT LONG TERM GOAL #5   Title She will be able to sit fot 45-60 min before increased pain    Time 6    Period Weeks    Status New                 Plan - 07/28/20 1625    Clinical Impression Statement Patient tolerated session well today with minor reports of Rt low back pain with sidelying hip abduction, otherwise no reports of pain. She has poor core stabilization requiring heavy verbal, tactile, and visual cues for  proper activation of transverse abdominis and performance of pelvic tilts. With heavy cueing patient able to engage transverse abdominis for short duration. She requires continued emphasis on core stabilization and hip strengthening.    Personal Factors and Comorbidities Age;Time since onset of injury/illness/exacerbation;Past/Current Experience;Comorbidity 1    Comorbidities pacemaker    Examination-Activity Limitations Sit;Carry;Lift;Stairs    Examination-Participation Restrictions Cleaning;Meal Prep;Community Activity;Laundry    Stability/Clinical Decision Making Stable/Uncomplicated    Rehab Potential Good    PT Frequency 2x / week    PT Duration 6 weeks    PT Treatment/Interventions Passive range of motion;Dry needling;Manual techniques;Patient/family education;Therapeutic exercise;Therapeutic activities;Moist Heat    PT Next Visit Plan Update HEP.   Progress core and hip strength as toleratd. NO Electric Stim  due to PACEMAKER   Pt speaks Hindi as language of choice so may need interpreter if instructions are not clear to her    PT Home Exercise Plan Access Code [email or web address]    Consulted and Agree with Plan of Care Patient           Patient will benefit from skilled therapeutic intervention in order to improve the following deficits and impairments:  Pain,Postural dysfunction,Decreased activity tolerance,Increased muscle spasms,Decreased range of motion,Decreased strength  Visit Diagnosis: Chronic bilateral low back pain, unspecified whether sciatica present  Muscle spasm of back  Abnormal posture  Joint stiffness of spine     Problem List Patient Active Problem List   Diagnosis Date Noted  . Stable angina (HCC) 08/14/2019  . Abnormal stress test 08/14/2019  . Encounter for care of pacemaker 08/08/2019  . Atypical chest pain 08/08/2019  .  Pacemaker St Jude dual chamber MRI Assurity Dr-Rf - [id number] on 03/05/19 03/05/2019  . Third degree AV block (HCC) 03/04/2019  .  Bradycardia 03/04/2019  . Dizziness 03/04/2019  . Syncope and collapse 03/04/2019  . Exertional dyspnea 03/04/2019  . Mixed hyperlipidemia 03/04/2019  . Essential hypertension 03/04/2019   Michelle Moran, PT, DPT, ATC 07/28/20 4:39 PM  [facility] Outpatient Rehabilitation [facility] [address] [place], [place], [address] Phone: [phone]   Fax:  [phone]  Name: Vetta Couzens MRN: [id number] Date of Birth: September 21, 1955

## 2020-08-02 ENCOUNTER — Ambulatory Visit: Payer: Medicare Other

## 2020-08-02 ENCOUNTER — Other Ambulatory Visit: Payer: Self-pay

## 2020-08-02 DIAGNOSIS — R293 Abnormal posture: Secondary | ICD-10-CM

## 2020-08-02 DIAGNOSIS — M545 Low back pain, unspecified: Secondary | ICD-10-CM | POA: Diagnosis not present

## 2020-08-02 DIAGNOSIS — G8929 Other chronic pain: Secondary | ICD-10-CM

## 2020-08-02 DIAGNOSIS — M62830 Muscle spasm of back: Secondary | ICD-10-CM

## 2020-08-02 DIAGNOSIS — M2560 Stiffness of unspecified joint, not elsewhere classified: Secondary | ICD-10-CM

## 2020-08-02 NOTE — Therapy (Signed)
[facility] Outpatient Rehabilitation [facility] [address] [place], [place], [address] Phone: [phone]   Fax:  [phone]  Physical Therapy Treatment  Patient Details  Name: Michelle Moran MRN: [id number] Date of Birth: [birth year] Referring Provider (PT): Teryl Lucy, MD   Encounter Date: 08/02/2020   PT End of Session - 08/02/20 1527    Visit Number 5    Number of Visits 12    Date for PT Re-Evaluation 08/13/20    Authorization Type [facility]    PT Start Time 1500    PT Stop Time 1545    PT Time Calculation (min) 45 min    Activity Tolerance Patient tolerated treatment well    Behavior During Therapy [facility] for tasks assessed/performed           Past Medical History:  Diagnosis Date  . Allergy   . Encounter for care of pacemaker 08/08/2019  . Hyperlipidemia   . Hypertension   . Pacemaker St Jude dual chamber MRI Assurity Dr-Rf - [id number] on 03/05/19 03/05/2019  . Third degree AV block (HCC) 03/04/2019    Past Surgical History:  Procedure Laterality Date  . CHOLECYSTECTOMY  2004  . EYE SURGERY    . INNER EAR SURGERY  2004  . PACEMAKER IMPLANT N/A 03/05/2019   Procedure: PACEMAKER IMPLANT;  Surgeon: Marinus Maw, MD;  Location: [facility] INVASIVE CV LAB;  Service: Cardiovascular;  Laterality: N/A;    There were no vitals filed for this visit.   Subjective Assessment - 08/02/20 1517    Subjective Patient reports having some pain in the back of her Rt knee that began on Saturday evening after house/yardwork, but it has improved since Saturday and only hurts "a little bit" now with walking and standing. She denies any back pain currently and states she has a history of pain in the back of her knee.    Pertinent History pacemaker,  RT and Lt side injection    Currently in Pain? No/denies              [facility] PT Assessment - 08/02/20 0001      Observation/Other Assessments   Observations calf circumference 43.5 cm Lt, 44 cm Rt, no obvious  swelling, discoloration about the RLE      Palpation   Palpation comment no palpable tenderness about the Rt knee      Special Tests   Other special tests (+) SLR RLE                         [facility] Adult PT Treatment/Exercise - 08/02/20 0001      Self-Care   Other Self-Care Comments  see patient education      Lumbar Exercises: Stretches   Other Lumbar Stretch Exercise LTR 2 min      Lumbar Exercises: Seated   Other Seated Lumbar Exercises pelvic tilt 2 x 10    Other Seated Lumbar Exercises seated march 1 x 20      Lumbar Exercises: Supine   Pelvic Tilt 20 reps    Pelvic Tilt Limitations visual cues    Bent Knee Raise Limitations 2 x 20    Bridge with Ball Squeeze Limitations 2 x 10      Lumbar Exercises: Sidelying   Hip Abduction Limitations 2x15                  PT Education - 08/02/20 1527    Education Details Education on signs and symptoms  of DVT that would require immediate medical attention.    Person(s) Educated Patient    Methods Explanation    Comprehension Verbalized understanding            PT Short Term Goals - 07/28/20 1606      PT SHORT TERM GOAL #1   Title She will be indpendent with initial hEP.    Time 3    Period Weeks    Status Achieved      PT SHORT TERM GOAL #2   Title She will report pain decreased 25% or more    Time 3    Period Weeks    Status On-going      PT SHORT TERM GOAL #3   Title FOTO improved to  50%    Time 3    Period Weeks    Status Deferred      PT SHORT TERM GOAL #4   Title She will be able to flex 60 degrees before incr pain    Time 3    Period Weeks    Status Achieved             PT Long Term Goals - 06/30/20 1555      PT LONG TERM GOAL #1   Title She will be independent with all hEp issued    Time 6    Period Weeks    Status New      PT LONG TERM GOAL #2   Title She will report her pain as  intermittant and decreased 50% or more    Time 6    Period Weeks    Status New       PT LONG TERM GOAL #3   Title FOTO improved to  60%    Time 6    Period Weeks    Status New      PT LONG TERM GOAL #4   Title She will report able to done normal home tasks with min pain.    Time 6    Period Weeks    Status New      PT LONG TERM GOAL #5   Title She will be able to sit fot 45-60 min before increased pain    Time 6    Period Weeks    Status New                 Plan - 08/02/20 1520    Clinical Impression Statement Patient reports having pain in the popliteal fossa of the RLE that began on Saturday evening after yard/housework that has since reduced, though still has pain with walking and standing activity. Patient has no signs/symptoms that would suggest DVT (no palpable tenderness, no obvious swelling/discoloration about the RLE), but patient was educated on signs and symptoms that would warrant immediate medical attention verablizing understanding. Her Rt knee pain appears radicular in nature as she reports concordant pain with SLR. Overall good tolerance to today's session without reports of pain with progression of hip and core strengthening.    Personal Factors and Comorbidities Age;Time since onset of injury/illness/exacerbation;Past/Current Experience;Comorbidity 1    Comorbidities pacemaker    Examination-Activity Limitations Sit;Carry;Lift;Stairs    Examination-Participation Restrictions Cleaning;Meal Prep;Community Activity;Laundry    Stability/Clinical Decision Making Stable/Uncomplicated    Rehab Potential Good    PT Frequency 2x / week    PT Duration 6 weeks    PT Treatment/Interventions Passive range of motion;Dry needling;Manual techniques;Patient/family education;Therapeutic exercise;Therapeutic activities;Moist Heat    PT Next Visit  Plan Update HEP.   Progress core and hip strength as toleratd. NO Electric Stim  due to PACEMAKER   Pt speaks Hindi as language of choice so may need interpreter if instructions are not clear to her    PT Home  Exercise Plan Access Code [email or web address]    Consulted and Agree with Plan of Care Patient           Patient will benefit from skilled therapeutic intervention in order to improve the following deficits and impairments:  Pain,Postural dysfunction,Decreased activity tolerance,Increased muscle spasms,Decreased range of motion,Decreased strength  Visit Diagnosis: Chronic bilateral low back pain, unspecified whether sciatica present  Muscle spasm of back  Abnormal posture  Joint stiffness of spine     Problem List Patient Active Problem List   Diagnosis Date Noted  . Stable angina (HCC) 08/14/2019  . Abnormal stress test 08/14/2019  . Encounter for care of pacemaker 08/08/2019  . Atypical chest pain 08/08/2019  . Pacemaker St Jude dual chamber MRI Assurity Dr-Rf - [id number] on 03/05/19 03/05/2019  . Third degree AV block (HCC) 03/04/2019  . Bradycardia 03/04/2019  . Dizziness 03/04/2019  . Syncope and collapse 03/04/2019  . Exertional dyspnea 03/04/2019  . Mixed hyperlipidemia 03/04/2019  . Essential hypertension 03/04/2019   [name], PT, DPT, ATC 08/02/20 3:47 PM  [facility] Outpatient Rehabilitation [facility] [address] [place], [place], [address] Phone: [phone]   Fax:  [phone]  Name: Kameko Hukill MRN: [id number] Date of Birth: October 27, 1955

## 2020-08-04 ENCOUNTER — Ambulatory Visit: Payer: Medicare Other | Attending: Orthopedic Surgery

## 2020-08-04 ENCOUNTER — Other Ambulatory Visit: Payer: Self-pay

## 2020-08-04 DIAGNOSIS — G8929 Other chronic pain: Secondary | ICD-10-CM | POA: Insufficient documentation

## 2020-08-04 DIAGNOSIS — M2560 Stiffness of unspecified joint, not elsewhere classified: Secondary | ICD-10-CM

## 2020-08-04 DIAGNOSIS — M545 Low back pain, unspecified: Secondary | ICD-10-CM

## 2020-08-04 DIAGNOSIS — R293 Abnormal posture: Secondary | ICD-10-CM | POA: Diagnosis present

## 2020-08-04 DIAGNOSIS — M62830 Muscle spasm of back: Secondary | ICD-10-CM | POA: Diagnosis present

## 2020-08-04 NOTE — Therapy (Signed)
[place], [place], [address] Phone: [phone]   Fax:  [phone]  Physical Therapy Treatment  Patient Details  Name: Michelle Moran MRN: [id number] Date of Birth: [birth year] Referring Provider (PT): Marchia Bond, MD   Encounter Date: 08/04/2020   PT End of Session - 08/04/20 1454    Visit Number 6    Number of Visits 12    Date for PT Re-Evaluation 08/13/20    Authorization Type [employer]HCM[employer]    PT Start Time 1455    PT Stop Time 1540    PT Time Calculation (min) 45 min    Activity Tolerance Patient tolerated treatment well    Behavior During Therapy [facility] for tasks assessed/performed           Past Medical History:  Diagnosis Date  . Allergy   . Encounter for care of pacemaker 08/08/2019  . Hyperlipidemia   . Hypertension   . Pacemaker St Jude dual chamber MRI Assurity Dr-Rf - [id number] on 03/05/19 03/05/2019  . Third degree AV block ([place]) 03/04/2019    Past Surgical History:  Procedure Laterality Date  . CHOLECYSTECTOMY  2004  . EYE SURGERY    . INNER EAR SURGERY  2004  . PACEMAKER IMPLANT N/A 03/05/2019   Procedure: PACEMAKER IMPLANT;  Surgeon: Evans Lance, MD;  Location: [facility] [facility];  Service: Cardiovascular;  Laterality: N/A;    There were no vitals filed for this visit.   Subjective Assessment - 08/04/20 1502    Subjective Patient reports she is feeling well today without complaints of pain. She reports the pain she was feeling in the back of her knee at last session has resolved. She reports some pain yesterday in her back, but was busy doing a lot of housework.    Pertinent History pacemaker,  RT and Lt side injection    Currently in Pain? No/denies              [facility] PT Assessment - 08/04/20 0001      Observation/Other Assessments   Focus on Therapeutic Outcomes (FOTO)  53% function                         [facility] Adult PT Treatment/Exercise -  08/04/20 0001      Self-Care   Other Self-Care Comments  see patient education      Lumbar Exercises: Seated   Other Seated Lumbar Exercises pelvic tilt 2 x10    Other Seated Lumbar Exercises seated march 2  x10      Lumbar Exercises: Supine   Pelvic Tilt 20 reps    Bent Knee Raise Limitations 2 x 20    Bridge with clamshell 10 reps   2 sets; green     Lumbar Exercises: Sidelying   Clam Limitations 2 x 10; green                  PT Education - 08/04/20 1520    Education Details Education on FOTO score and overall functional progress. Education on updated HEP.    Person(s) Educated Patient    Methods Explanation;Demonstration;Verbal cues;Handout    Comprehension Verbalized understanding;Returned demonstration;Verbal cues required            PT Short Term Goals - 08/04/20 1510      PT SHORT TERM GOAL #1   Title She will be indpendent with initial hEP.    Time 3  Period Weeks    Status Achieved      PT SHORT TERM GOAL #2   Title She will report pain decreased 25% or more    Baseline pain at worse 6-7/10 with prolonged household activities    Time 3    Period Weeks    Status Achieved      PT SHORT TERM GOAL #3   Title FOTO improved to  50%    Time 3    Period Weeks    Status Achieved      PT SHORT TERM GOAL #4   Title She will be able to flex 60 degrees before incr pain    Time 3    Period Weeks    Status Achieved             PT Long Term Goals - 08/04/20 1543      PT LONG TERM GOAL #1   Title She will be independent with all hEp issued    Time 6    Period Weeks    Status On-going      PT LONG TERM GOAL #2   Title She will report her pain as  intermittant and decreased 50% or more    Baseline pain is intermittent at worst 6-7/10    Time 6    Period Weeks    Status On-going      PT LONG TERM GOAL #3   Title FOTO improved to  60%    Time 6    Period Weeks    Status On-going      PT LONG TERM GOAL #4   Title She will report able to  done normal home tasks with min pain.    Baseline 6-7/10 at worst    Time 6    Period Weeks    Status On-going      PT LONG TERM GOAL #5   Title She will be able to sit fot 45-60 min before increased pain    Baseline patient reports ability to sit for 3-4 hours    Time 6    Period Weeks    Status Achieved                 Plan - 08/04/20 1511    Clinical Impression Statement Patient has met all short term functional goals and is progressing well towards long term functional goals. She has improved carryover of proper performance of pelvic tilts requiring minimal visual cueing for proper movement initially. She is demonstrating gradual improvements in core stabilization, though requires continued focus on dynamic core stabilization in supine and sitting prior to progressing to standing/functional activity.    Personal Factors and Comorbidities Age;Time since onset of injury/illness/exacerbation;Past/Current Experience;Comorbidity 1    Comorbidities pacemaker    Examination-Activity Limitations Sit;Carry;Lift;Stairs    Examination-Participation Restrictions Cleaning;Meal Prep;Community Activity;Laundry    Stability/Clinical Decision Making Stable/Uncomplicated    Rehab Potential Good    PT Frequency 2x / week    PT Duration 6 weeks    PT Treatment/Interventions Passive range of motion;Dry needling;Manual techniques;Patient/family education;Therapeutic exercise;Therapeutic activities;Moist Heat    PT Next Visit Plan Progress core and hip strength as toleratd. hip hinge, pallof press, standing hip extension,abduction NO Electric Stim  due to PACEMAKER   Pt speaks Hindi as language of choice so may need interpreter if instructions are not clear to her    PT Home Exercise Plan Access Code [email or web address]    Consulted and Agree with Plan of Care Patient  Patient will benefit from skilled therapeutic intervention in order to improve the following deficits and impairments:   Pain,Postural dysfunction,Decreased activity tolerance,Increased muscle spasms,Decreased range of motion,Decreased strength  Visit Diagnosis: Chronic bilateral low back pain, unspecified whether sciatica present  Muscle spasm of back  Abnormal posture  Joint stiffness of spine     Problem List Patient Active Problem List   Diagnosis Date Noted  . Stable angina ([place]) 08/14/2019  . Abnormal stress test 08/14/2019  . Encounter for care of pacemaker 08/08/2019  . Atypical chest pain 08/08/2019  . Pacemaker St Jude dual chamber MRI Assurity Dr-Rf - [id number] on 03/05/19 03/05/2019  . Third degree AV block ([place]) 03/04/2019  . Bradycardia 03/04/2019  . Dizziness 03/04/2019  . Syncope and collapse 03/04/2019  . Exertional dyspnea 03/04/2019  . Mixed hyperlipidemia 03/04/2019  . Essential hypertension 03/04/2019   Gwendolyn Grant, PT, DPT, ATC 08/04/20 3:44 PM [facility] [address] [place], [place], [address] Phone: [phone]   Fax:  [phone]  Name: Michelle Moran MRN: [id number] Date of Birth: 08-May-1956

## 2020-08-09 ENCOUNTER — Other Ambulatory Visit: Payer: Self-pay

## 2020-08-09 ENCOUNTER — Ambulatory Visit: Payer: Medicare Other

## 2020-08-09 DIAGNOSIS — M545 Low back pain, unspecified: Secondary | ICD-10-CM

## 2020-08-09 DIAGNOSIS — M2560 Stiffness of unspecified joint, not elsewhere classified: Secondary | ICD-10-CM

## 2020-08-09 DIAGNOSIS — M62830 Muscle spasm of back: Secondary | ICD-10-CM

## 2020-08-09 DIAGNOSIS — R293 Abnormal posture: Secondary | ICD-10-CM

## 2020-08-09 DIAGNOSIS — G8929 Other chronic pain: Secondary | ICD-10-CM

## 2020-08-09 NOTE — Therapy (Signed)
[facility] [address] [place], [place], [address] Phone: [phone]   Fax:  [phone]  Physical Therapy Treatment  Patient Details  Name: Michelle Moran MRN: [id number] Date of Birth: [birth year] Referring Provider (PT): Teryl Lucy, MD   Encounter Date: 08/09/2020   PT End of Session - 08/09/20 1453    Visit Number 7    Number of Visits 12    Date for PT Re-Evaluation 08/13/20    Authorization Type UHCMCR    PT Start Time 1455    PT Stop Time 1540    PT Time Calculation (min) 45 min    Activity Tolerance Patient tolerated treatment well    Behavior During Therapy [facility] for tasks assessed/performed           Past Medical History:  Diagnosis Date  . Allergy   . Encounter for care of pacemaker 08/08/2019  . Hyperlipidemia   . Hypertension   . Pacemaker St Jude dual chamber MRI Assurity Dr-Rf - [id number] on 03/05/19 03/05/2019  . Third degree AV block ([facility]) 03/04/2019    Past Surgical History:  Procedure Laterality Date  . CHOLECYSTECTOMY  2004  . EYE SURGERY    . INNER EAR SURGERY  2004  . PACEMAKER IMPLANT N/A 03/05/2019   Procedure: PACEMAKER IMPLANT;  Surgeon: Marinus Maw, MD;  Location: [facility] INVASIVE CV LAB;  Service: Cardiovascular;  Laterality: N/A;    There were no vitals filed for this visit.   Subjective Assessment - 08/09/20 1454    Subjective She reports "little pain" currently along Rt side of low back and Rt posterior LE.    Pertinent History pacemaker,  RT and Lt side injection    Currently in Pain? Yes    Pain Score 3     Pain Location Back    Pain Orientation Right    Pain Descriptors / Indicators Aching    Pain Type Chronic pain    Pain Radiating Towards Rt buttock into posterior thigh    Pain Onset More than a month ago    Pain Frequency Intermittent                             [facility] Adult PT Treatment/Exercise - 08/09/20 0001      Self-Care   Other  Self-Care Comments  see patient education      Lumbar Exercises: Stretches   Other Lumbar Stretch Exercise sciatic nerve glide 1 x 10 RLE      Lumbar Exercises: Standing   Heel Raises Limitations 2 x 10    Functional Squats Limitations 2 x 10 mini squat with UE support heavy cues    Other Standing Lumbar Exercises hip abduction and standing march 2x  10    Other Standing Lumbar Exercises hip extension 2 x 10;      Lumbar Exercises: Seated   Other Seated Lumbar Exercises pelvic tilt 2 x 10    Other Seated Lumbar Exercises seated march 2 x10                  PT Education - 08/09/20 1522    Education Details Updated HEP.    Person(s) Educated Patient    Methods Explanation;Demonstration;Verbal cues;Handout    Comprehension Verbalized understanding;Returned demonstration            PT Short Term Goals - 08/04/20 1510      PT SHORT TERM GOAL #1   Title  She will be indpendent with initial hEP.    Time 3    Period Weeks    Status Achieved      PT SHORT TERM GOAL #2   Title She will report pain decreased 25% or more    Baseline pain at worse 6-7/10 with prolonged household activities    Time 3    Period Weeks    Status Achieved      PT SHORT TERM GOAL #3   Title FOTO improved to  50%    Time 3    Period Weeks    Status Achieved      PT SHORT TERM GOAL #4   Title She will be able to flex 60 degrees before incr pain    Time 3    Period Weeks    Status Achieved             PT Long Term Goals - 08/04/20 1543      PT LONG TERM GOAL #1   Title She will be independent with all hEp issued    Time 6    Period Weeks    Status On-going      PT LONG TERM GOAL #2   Title She will report her pain as  intermittant and decreased 50% or more    Baseline pain is intermittent at worst 6-7/10    Time 6    Period Weeks    Status On-going      PT LONG TERM GOAL #3   Title FOTO improved to  60%    Time 6    Period Weeks    Status On-going      PT LONG TERM  GOAL #4   Title She will report able to done normal home tasks with min pain.    Baseline 6-7/10 at worst    Time 6    Period Weeks    Status On-going      PT LONG TERM GOAL #5   Title She will be able to sit fot 45-60 min before increased pain    Baseline patient reports ability to sit for 3-4 hours    Time 6    Period Weeks    Status Achieved                 Plan - 08/09/20 1502    Clinical Impression Statement Overall good tolerance to today's session with patient reporting a reduction in low back pain at end of session rated as 1/10. Patient reported mild pain in Rt posterior thigh when completing seated marching, though with implementing Rt sciatic nerve glides patient denied any pain during second set of seated marching. Able to introduce standing hip and core exercises with patient requiring minimal postural cues. Patient requires heavy cues for proper performance of mini squat as she has tendency to either allow for increased lumbar flexion or excessive anterior tibial translation, though with heavy cues and continues practice patient demonstrates proper form.    Personal Factors and Comorbidities Age;Time since onset of injury/illness/exacerbation;Past/Current Experience;Comorbidity 1    Comorbidities pacemaker    Examination-Activity Limitations Sit;Carry;Lift;Stairs    Examination-Participation Restrictions Cleaning;Meal Prep;Community Activity;Laundry    Stability/Clinical Decision Making Stable/Uncomplicated    Rehab Potential Good    PT Frequency 2x / week    PT Duration 6 weeks    PT Treatment/Interventions Passive range of motion;Dry needling;Manual techniques;Patient/family education;Therapeutic exercise;Therapeutic activities;Moist Heat    PT Next Visit Plan Re-eval. Progress core and hip strength as toleratd.  hip hinge, pallof press, standing hip extension,abduction NO Electric Stim  due to PACEMAKER   Pt speaks Hindi as language of choice so may need interpreter  if instructions are not clear to her    PT Home Exercise Plan Access Code [email or web address]    Consulted and Agree with Plan of Care Patient           Patient will benefit from skilled therapeutic intervention in order to improve the following deficits and impairments:  Pain,Postural dysfunction,Decreased activity tolerance,Increased muscle spasms,Decreased range of motion,Decreased strength  Visit Diagnosis: Chronic bilateral low back pain, unspecified whether sciatica present  Muscle spasm of back  Abnormal posture  Joint stiffness of spine     Problem List Patient Active Problem List   Diagnosis Date Noted  . Stable angina ([facility]) 08/14/2019  . Abnormal stress test 08/14/2019  . Encounter for care of pacemaker 08/08/2019  . Atypical chest pain 08/08/2019  . Pacemaker St Jude dual chamber MRI Assurity Dr-Rf - [id number] on 03/05/19 03/05/2019  . Third degree AV block ([facility]) 03/04/2019  . Bradycardia 03/04/2019  . Dizziness 03/04/2019  . Syncope and collapse 03/04/2019  . Exertional dyspnea 03/04/2019  . Mixed hyperlipidemia 03/04/2019  . Essential hypertension 03/04/2019   [name], PT, DPT, ATC 08/09/20 3:44 PM [facility] [address] [place], [place], [address] Phone: [phone]   Fax:  [phone]  Name: Michelle Moran MRN: [id number] Date of Birth: 09/17/1955

## 2020-08-11 ENCOUNTER — Other Ambulatory Visit: Payer: Self-pay

## 2020-08-11 ENCOUNTER — Ambulatory Visit: Payer: Medicare Other

## 2020-08-11 DIAGNOSIS — M545 Low back pain, unspecified: Secondary | ICD-10-CM

## 2020-08-11 DIAGNOSIS — M2560 Stiffness of unspecified joint, not elsewhere classified: Secondary | ICD-10-CM

## 2020-08-11 DIAGNOSIS — R293 Abnormal posture: Secondary | ICD-10-CM

## 2020-08-11 DIAGNOSIS — M62830 Muscle spasm of back: Secondary | ICD-10-CM

## 2020-08-11 NOTE — Therapy (Addendum)
[facility] [address] [place], [place], [address] Phone: [phone]   Fax:  [phone]  Physical Therapy Treatment/Re-certification  Progress Note Reporting Period 06/30/20 to 08/11/20  See note below for Objective Data and Assessment of Progress/Goals.       Patient Details  Name: Michelle Moran MRN: [id number] Date of Birth: 1955-12-30 Referring Provider (PT): Teryl Lucy, MD   Encounter Date: 08/11/2020   PT End of Session - 08/11/20 1451    Visit Number 8    Number of Visits 16    Date for PT Re-Evaluation 09/11/20    Authorization Type [facility]    PT Start Time 1458    PT Stop Time 1543    PT Time Calculation (min) 45 min    Activity Tolerance Patient tolerated treatment well    Behavior During Therapy [facility] for tasks assessed/performed           Past Medical History:  Diagnosis Date  . Allergy   . Encounter for care of pacemaker 08/08/2019  . Hyperlipidemia   . Hypertension   . Pacemaker St Jude dual chamber MRI Assurity Dr-Rf - [id number] on 03/05/19 03/05/2019  . Third degree AV block ([facility]) 03/04/2019    Past Surgical History:  Procedure Laterality Date  . CHOLECYSTECTOMY  2004  . EYE SURGERY    . INNER EAR SURGERY  2004  . PACEMAKER IMPLANT N/A 03/05/2019   Procedure: PACEMAKER IMPLANT;  Surgeon: Marinus Maw, MD;  Location: [facility] INVASIVE CV LAB;  Service: Cardiovascular;  Laterality: N/A;    There were no vitals filed for this visit.   Subjective Assessment - 08/11/20 1500    Subjective Patient reports the back is still a little bit of a problem, though it has improved since the start of care. She reports continued back pain with bending, lifting, and stair negotiation. She has f/u with physician next week.    Pertinent History pacemaker,  RT and Lt side injection    How long can you sit comfortably? "sitting no trouble"    How long can you stand comfortably? standing is ok,but bending hurts     How long can you walk comfortably? walking fast causes pain.    Currently in Pain? Yes    Pain Score 2     Pain Location Back    Pain Orientation Right    Pain Descriptors / Indicators Aching    Pain Type Chronic pain    Pain Onset More than a month ago    Pain Frequency Intermittent              [facility] PT Assessment - 08/11/20 0001      Observation/Other Assessments   Focus on Therapeutic Outcomes (FOTO)  57% function      AROM   Lumbar Flexion 90 pain upon return to standing    Lumbar Extension WFL mild pain    Lumbar - Right Side Bend fingertip to lateral joint line    Lumbar - Left Side Bend fingertip to lateral joint line    Lumbar - Right Rotation WFL    Lumbar - Left Rotation WFL stretching along Rt low back      Strength   Overall Strength Comments gross BLE strength 4+/5 with exception of hip abductors and extensors 4-/5 bilaterally      Flexibility   Hamstrings WNL    Quadriceps 25% limitation blaterally      Palpation   Palpation comment TTP Rt gluteal musculature, Rt  lumbar paraspinals, Rt posterior rotated innominate      Special Tests   Other special tests (+) SLR RLE (+) FABER RLE (-) FABER LLE                         [facility] Adult PT Treatment/Exercise - 08/11/20 0001      Self-Care   Other Self-Care Comments  see patient education      Lumbar Exercises: Standing   Functional Squats 10 reps    Functional Squats Limitations to chair      Lumbar Exercises: Seated   Other Seated Lumbar Exercises pelvic tilt 2 x 10    Other Seated Lumbar Exercises seated march 2 x 20      Manual Therapy   Manual therapy comments muscle energy technique to improve Rt posterior rotation, STM to Rt gluteal musculature, Rt lumbar paraspinals                  PT Education - 08/11/20 1530    Education Details Education on re-assessment findings and POC moving forward. Postural education    Person(s) Educated Patient    Methods Explanation     Comprehension Verbalized understanding            PT Short Term Goals - 08/11/20 1531      PT SHORT TERM GOAL #1   Title She will be indpendent with initial hEP.    Time 3    Period Weeks    Status Achieved      PT SHORT TERM GOAL #2   Title She will report pain decreased 25% or more    Baseline pain at worse 6-7/10 with bending and stairs; initial 9/10    Time 3    Period Weeks    Status Achieved      PT SHORT TERM GOAL #3   Title FOTO improved to  50%    Time 3    Period Weeks    Status Achieved      PT SHORT TERM GOAL #4   Title She will be able to flex 60 degrees before incr pain    Time 3    Period Weeks    Status Achieved             PT Long Term Goals - 08/11/20 1534      PT LONG TERM GOAL #1   Title She will be independent with all hEp issued    Time 6    Period Weeks    Status On-going      PT LONG TERM GOAL #2   Title She will report her pain as  intermittant and decreased 50% or more    Baseline pain is intermittent at worst 6-7/10 with bending and stairs    Time 6    Period Weeks    Status On-going      PT LONG TERM GOAL #3   Title FOTO improved to  60%    Baseline 57%    Time 6    Period Weeks    Status On-going      PT LONG TERM GOAL #4   Title She will report able to done normal home tasks with min pain.    Baseline 2/10 with household tasks (cooking and and cleaning)    Time 6    Period Weeks    Status Achieved      PT LONG TERM GOAL #5   Title She will be  able to sit fot 45-60 min before increased pain    Baseline patient reports ability to sit for 3-4 hours    Time 6    Period Weeks    Status Achieved                 Plan - 08/11/20 1450    Clinical Impression Statement Patient reports subjective overall improvement of her low back pain with daily activities since initiating care, though continues to have moderate pain with bending and stair negotiation. Her lumbar AROM has much improved with patient reporting mild  pain with lumbar extension and flexion AROM.Overall she has good LE strength with moderate weakness remaining in bilateral hip extensors and abductors, though has notable core weakness.  She should benefit from continued skilled PT to further improve her hip/core strength to allow for improved tolerance to standing and functional activities.    Personal Factors and Comorbidities Age;Time since onset of injury/illness/exacerbation;Past/Current Experience;Comorbidity 1    Comorbidities pacemaker    Examination-Activity Limitations Sit;Carry;Lift;Stairs    Examination-Participation Restrictions Cleaning;Meal Prep;Community Activity;Laundry    Stability/Clinical Decision Making Stable/Uncomplicated    Rehab Potential Good    PT Frequency --   1-2/week for 4 weeks   PT Duration 4 weeks    PT Treatment/Interventions Passive range of motion;Dry needling;Manual techniques;Patient/family education;Therapeutic exercise;Therapeutic activities;Moist Heat    PT Next Visit Plan Progress core and hip strength as toleratd. hip hinge, pallof press, standing hip extension,abduction NO Electric Stim  due to PACEMAKER   Pt speaks Hindi as language of choice so may need interpreter if instructions are not clear to her    PT Home Exercise Plan Access Code [email or web address]    Consulted and Agree with Plan of Care Patient           Patient will benefit from skilled therapeutic intervention in order to improve the following deficits and impairments:  Pain,Postural dysfunction,Decreased activity tolerance,Increased muscle spasms,Decreased range of motion,Decreased strength  Visit Diagnosis: Chronic bilateral low back pain, unspecified whether sciatica present  Muscle spasm of back  Abnormal posture  Joint stiffness of spine     Problem List Patient Active Problem List   Diagnosis Date Noted  . Stable angina ([facility]) 08/14/2019  . Abnormal stress test 08/14/2019  . Encounter for care of pacemaker 08/08/2019  .  Atypical chest pain 08/08/2019  . Pacemaker St Jude dual chamber MRI Assurity Dr-Rf - [id number] on 03/05/19 03/05/2019  . Third degree AV block ([facility]) 03/04/2019  . Bradycardia 03/04/2019  . Dizziness 03/04/2019  . Syncope and collapse 03/04/2019  . Exertional dyspnea 03/04/2019  . Mixed hyperlipidemia 03/04/2019  . Essential hypertension 03/04/2019   [name], PT, DPT, ATC 08/11/20 6:48 PM [facility] [address] [place], [place], [address] Phone: [phone]   Fax:  [phone]  Name: Michelle Moran MRN: [id number] Date of Birth: 1956-05-10

## 2020-08-18 ENCOUNTER — Ambulatory Visit: Payer: Medicare Other

## 2020-08-18 ENCOUNTER — Other Ambulatory Visit: Payer: Self-pay

## 2020-08-18 DIAGNOSIS — R293 Abnormal posture: Secondary | ICD-10-CM

## 2020-08-18 DIAGNOSIS — G8929 Other chronic pain: Secondary | ICD-10-CM

## 2020-08-18 DIAGNOSIS — M545 Low back pain, unspecified: Secondary | ICD-10-CM | POA: Diagnosis not present

## 2020-08-18 DIAGNOSIS — M62830 Muscle spasm of back: Secondary | ICD-10-CM

## 2020-08-18 DIAGNOSIS — M2560 Stiffness of unspecified joint, not elsewhere classified: Secondary | ICD-10-CM

## 2020-08-18 IMAGING — MG DIGITAL DIAGNOSTIC BILAT W/ TOMO W/ CAD
6 of 10 series · 6 of 30 positions shown · non-contrast
Comparison: Previous exam(s).

CLINICAL DATA: 63-year-old female presenting for delayed follow-up
of a likely benign right breast mass.Patient was last seen for this
in 7938.

EXAM:
DIGITAL DIAGNOSTIC BILATERAL MAMMOGRAM WITH CAD AND TOMO
ULTRASOUND RIGHT BREAST

[L MLO synth-2D (1 of 2)]
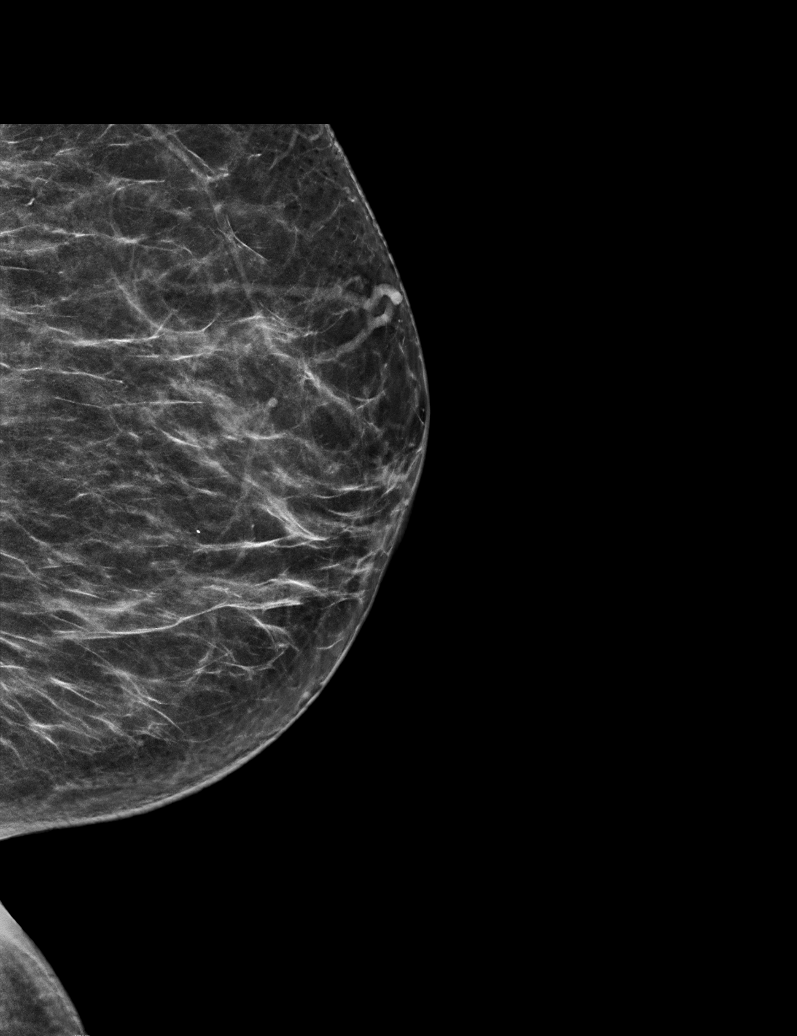

[L MLO synth-2D (2 of 2)]
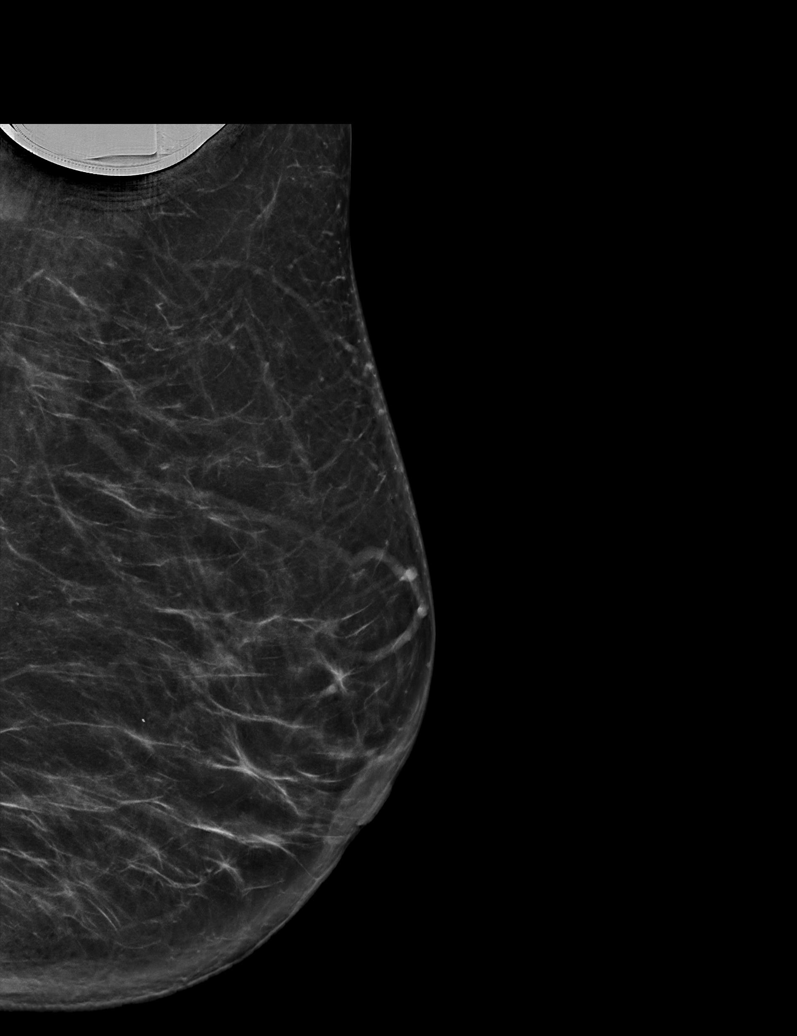

[R MLO synth-2D]
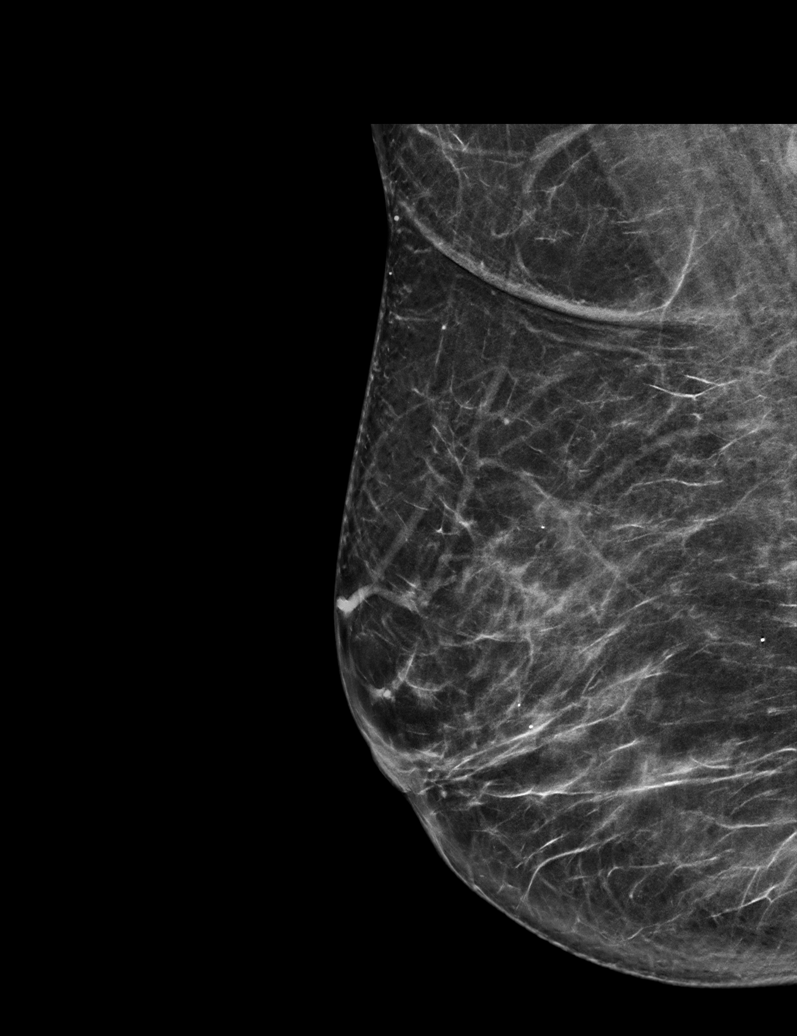

[R CC synth-2D]
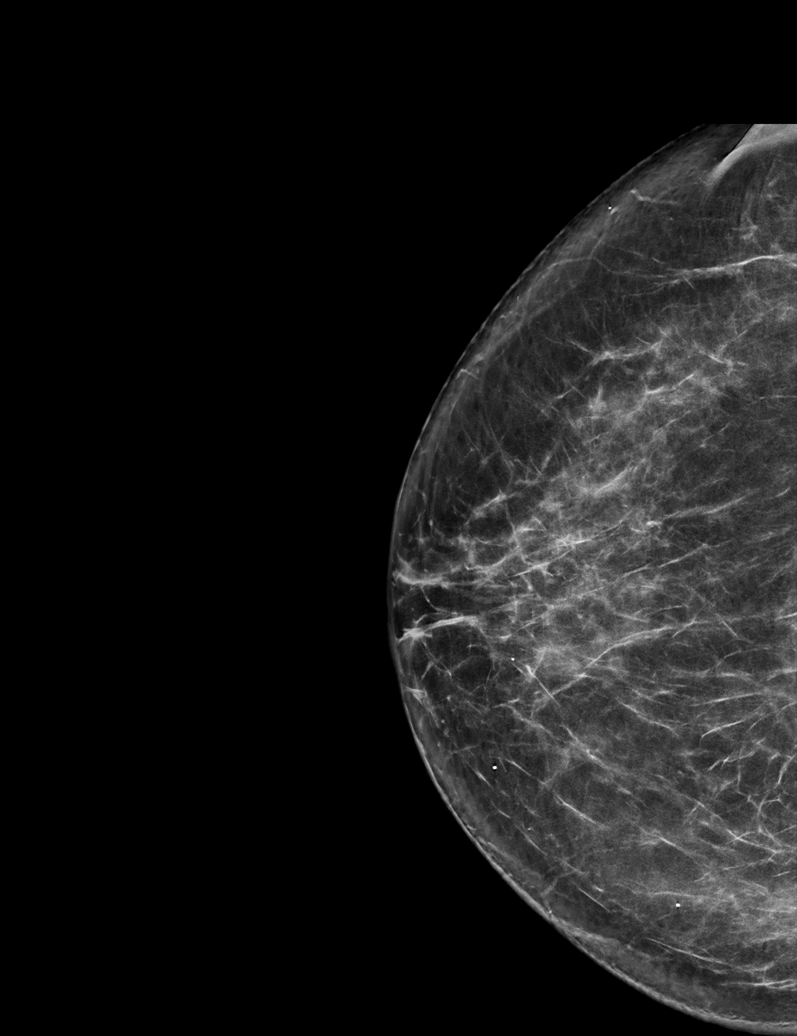

[L CC synth-2D]
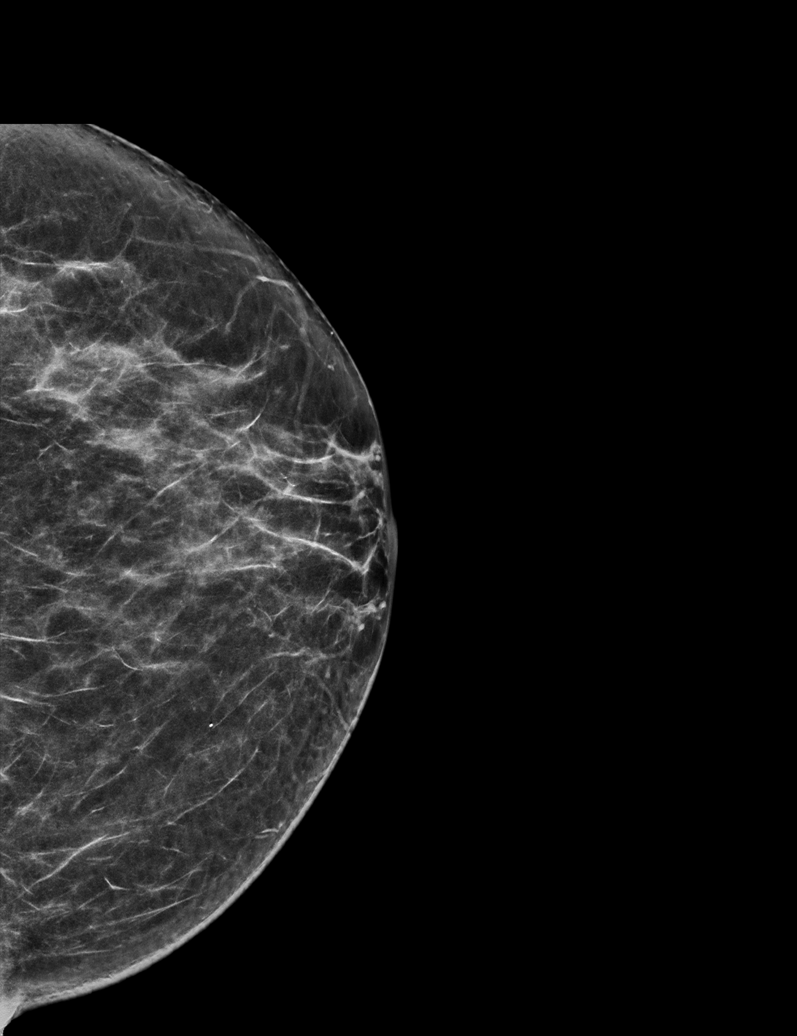

[R CC tomo · tomo slice 37/73.0]
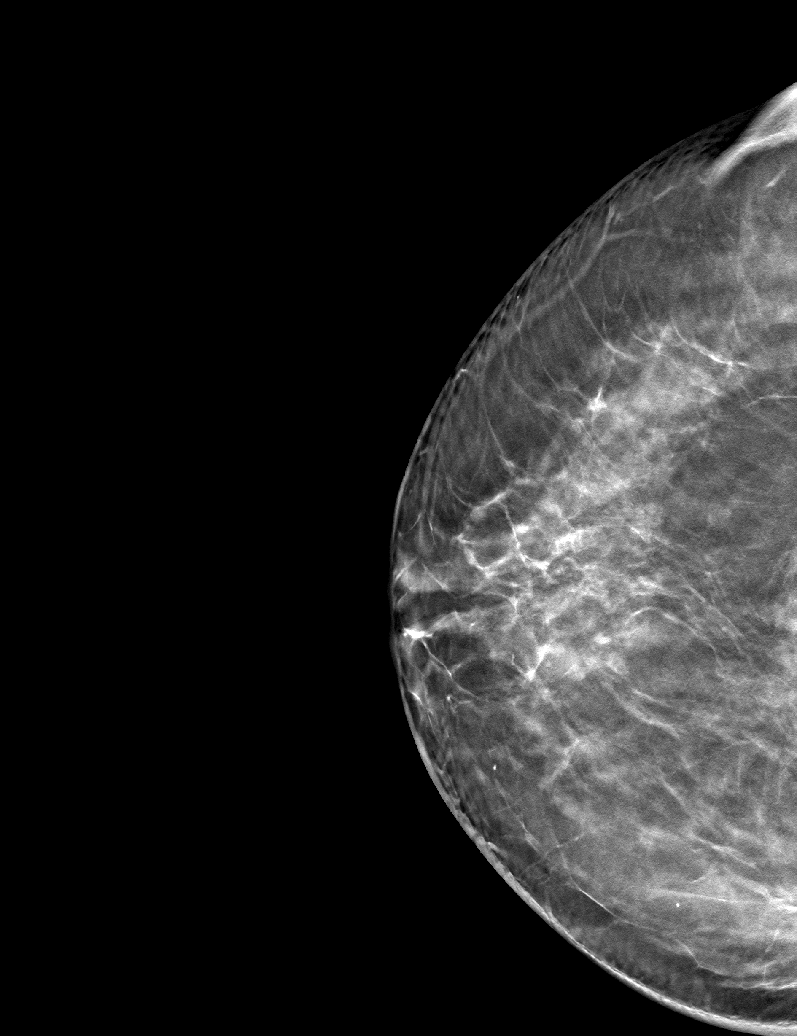

[6 of 30 positions shown; findings below may reference images not displayed]

ACR Breast Density Category b: There are scattered areas of
fibroglandular density.
FINDINGS: No suspicious calcifications, masses or areas of distortion are seen
in the bilateral breasts.

Mammographic images were processed with CAD.

Ultrasound targeted to the retroareolar right breast demonstrates a
circumscribed oval hypoechoic mass measuring 8 x 5 x 7 mm,
previously 9 x 5 x 9 mm.
IMPRESSION: 1. The retroareolar right breast mass has decreased in size, and is
a benign finding.

2.  No mammographic evidence of malignancy in the bilateral breasts.

RECOMMENDATION:
Screening mammogram in one year.(Code:[id number])

I have discussed the findings and recommendations with the patient.
If applicable, a reminder letter will be sent to the patient
regarding the next appointment.

BI-RADS CATEGORY  2: Benign.

## 2020-08-18 NOTE — Therapy (Signed)
[facility] Outpatient Rehabilitation [facility] [address] [place], [place], [address] Phone: [phone]   Fax:  [phone]  Physical Therapy Treatment  Patient Details  Name: Michelle Moran MRN: [id number] Date of Birth: 03/16/1956 Referring Provider (PT): Teryl Lucy, MD   Encounter Date: 08/18/2020   PT End of Session - 08/18/20 1503    Visit Number 9    Number of Visits 16    Date for PT Re-Evaluation 09/11/20    Authorization Type UHCMCR    Progress Note Due on Visit 18    PT Start Time 1500    PT Stop Time 1542    PT Time Calculation (min) 42 min    Activity Tolerance Patient tolerated treatment well    Behavior During Therapy [facility] for tasks assessed/performed           Past Medical History:  Diagnosis Date  . Allergy   . Encounter for care of pacemaker 08/08/2019  . Hyperlipidemia   . Hypertension   . Pacemaker St Jude dual chamber MRI Assurity Dr-Rf - [id number] on 03/05/19 03/05/2019  . Third degree AV block (HCC) 03/04/2019    Past Surgical History:  Procedure Laterality Date  . CHOLECYSTECTOMY  2004  . EYE SURGERY    . INNER EAR SURGERY  2004  . PACEMAKER IMPLANT N/A 03/05/2019   Procedure: PACEMAKER IMPLANT;  Surgeon: Marinus Maw, MD;  Location: [facility] INVASIVE CV LAB;  Service: Cardiovascular;  Laterality: N/A;    There were no vitals filed for this visit.   Subjective Assessment - 08/18/20 1502    Subjective Patient reports she is feeling "good" today without reports of pain. She saw physician this morning who is pleased with her progress in PT thus far.    Pertinent History pacemaker,  RT and Lt side injection    How long can you sit comfortably? "sitting no trouble"    How long can you stand comfortably? standing is ok,but bending hurts    How long can you walk comfortably? walking fast causes pain.    Currently in Pain? No/denies    Pain Onset More than a month ago                             [facility]  Adult PT Treatment/Exercise - 08/18/20 0001      Self-Care   Other Self-Care Comments  see patient education      Lumbar Exercises: Stretches   Other Lumbar Stretch Exercise LTR with figure 4 1 min each      Lumbar Exercises: Aerobic   Nustep 5 minutes UE/LE level 5      Lumbar Exercises: Machines for Strengthening   Leg Press 2 x 10; 20 lbs      Lumbar Exercises: Standing   Other Standing Lumbar Exercises hip hinge with dowel 1 x 5; hip hinge with 5 lb KB 2 x 10    Other Standing Lumbar Exercises hip abduction and extension 2  x 10      Lumbar Exercises: Seated   Other Seated Lumbar Exercises pelvic tilt 2 x10    Other Seated Lumbar Exercises seated march 2  x10                  PT Education - 08/18/20 1538    Education Details Updated HEP.    Person(s) Educated Patient    Methods Explanation;Demonstration;Handout    Comprehension Verbalized understanding;Returned demonstration  PT Short Term Goals - 08/11/20 1531      PT SHORT TERM GOAL #1   Title She will be indpendent with initial hEP.    Time 3    Period Weeks    Status Achieved      PT SHORT TERM GOAL #2   Title She will report pain decreased 25% or more    Baseline pain at worse 6-7/10 with bending and stairs; initial 9/10    Time 3    Period Weeks    Status Achieved      PT SHORT TERM GOAL #3   Title FOTO improved to  50%    Time 3    Period Weeks    Status Achieved      PT SHORT TERM GOAL #4   Title She will be able to flex 60 degrees before incr pain    Time 3    Period Weeks    Status Achieved             PT Long Term Goals - 08/11/20 1534      PT LONG TERM GOAL #1   Title She will be independent with all hEp issued    Time 6    Period Weeks    Status On-going      PT LONG TERM GOAL #2   Title She will report her pain as  intermittant and decreased 50% or more    Baseline pain is intermittent at worst 6-7/10 with bending and stairs    Time 6    Period Weeks     Status On-going      PT LONG TERM GOAL #3   Title FOTO improved to  60%    Baseline 57%    Time 6    Period Weeks    Status On-going      PT LONG TERM GOAL #4   Title She will report able to done normal home tasks with min pain.    Baseline 2/10 with household tasks (cooking and and cleaning)    Time 6    Period Weeks    Status Achieved      PT LONG TERM GOAL #5   Title She will be able to sit fot 45-60 min before increased pain    Baseline patient reports ability to sit for 3-4 hours    Time 6    Period Weeks    Status Achieved                 Plan - 08/18/20 1521    Clinical Impression Statement Able to progress standing/functional activity during today's session with patient requiring initial cues for proper completion of hip hinge as she demonstrates excessive trunk flexion, though with continued practice patient able to properly perform. She demonstrates improved lumbopelvic stability with standing activity requiring minimal postural cues during standing hip extension/abduction. Overall good tolerance to today's session with patient reporting "muscle stretch" in her Rt gluteals during hip abduction strengthening, otherwise no reports of pain/discomfort throughout session.    Personal Factors and Comorbidities Age;Time since onset of injury/illness/exacerbation;Past/Current Experience;Comorbidity 1    Comorbidities pacemaker    Examination-Activity Limitations Sit;Carry;Lift;Stairs    Examination-Participation Restrictions Cleaning;Meal Prep;Community Activity;Laundry    Stability/Clinical Decision Making Stable/Uncomplicated    Rehab Potential Good    PT Frequency --   1-2/week for 4 weeks   PT Duration 4 weeks    PT Treatment/Interventions Passive range of motion;Dry needling;Manual techniques;Patient/family education;Therapeutic exercise;Therapeutic activities;Moist Heat  PT Next Visit Plan Progress core and hip strength in standing.NO Electric Stim  due to  PACEMAKER   Pt speaks Hindi as language of choice so may need interpreter if instructions are not clear to her    PT Home Exercise Plan Access Code [email or web address]    Consulted and Agree with Plan of Care Patient           Patient will benefit from skilled therapeutic intervention in order to improve the following deficits and impairments:  Pain,Postural dysfunction,Decreased activity tolerance,Increased muscle spasms,Decreased range of motion,Decreased strength  Visit Diagnosis: Chronic bilateral low back pain, unspecified whether sciatica present  Muscle spasm of back  Abnormal posture  Joint stiffness of spine     Problem List Patient Active Problem List   Diagnosis Date Noted  . Stable angina (HCC) 08/14/2019  . Abnormal stress test 08/14/2019  . Encounter for care of pacemaker 08/08/2019  . Atypical chest pain 08/08/2019  . Pacemaker St Jude dual chamber MRI Assurity Dr-Rf - [id number] on 03/05/19 03/05/2019  . Third degree AV block (HCC) 03/04/2019  . Bradycardia 03/04/2019  . Dizziness 03/04/2019  . Syncope and collapse 03/04/2019  . Exertional dyspnea 03/04/2019  . Mixed hyperlipidemia 03/04/2019  . Essential hypertension 03/04/2019   [name], PT, DPT, ATC 08/18/20 3:44 PM [facility] [address] [place], [place], [address] Phone: [phone]   Fax:  [phone]  Name: Michelle Moran MRN: [id number] Date of Birth: [birth year]

## 2020-08-25 ENCOUNTER — Ambulatory Visit: Payer: Medicare Other

## 2020-08-25 ENCOUNTER — Other Ambulatory Visit: Payer: Self-pay

## 2020-08-25 DIAGNOSIS — M62830 Muscle spasm of back: Secondary | ICD-10-CM

## 2020-08-25 DIAGNOSIS — M545 Low back pain, unspecified: Secondary | ICD-10-CM | POA: Diagnosis not present

## 2020-08-25 DIAGNOSIS — G8929 Other chronic pain: Secondary | ICD-10-CM

## 2020-08-25 DIAGNOSIS — R293 Abnormal posture: Secondary | ICD-10-CM

## 2020-08-25 DIAGNOSIS — M2560 Stiffness of unspecified joint, not elsewhere classified: Secondary | ICD-10-CM

## 2020-08-25 NOTE — Therapy (Signed)
[facility] Outpatient Rehabilitation [facility] [address] [place], [place], [address] Phone: [phone]   Fax:  [phone]  Physical Therapy Treatment  Patient Details  Name: Michelle Moran MRN: [id number] Date of Birth: 13-Dec-1955 Referring Provider (PT): Teryl Lucy, MD   Encounter Date: 08/25/2020   PT End of Session - 08/25/20 1010    Visit Number 10    Number of Visits 16    Date for PT Re-Evaluation 09/11/20    Authorization Type UHCMCR    Progress Note Due on Visit 18    PT Start Time 1013    PT Stop Time 1056    PT Time Calculation (min) 43 min    Activity Tolerance Patient tolerated treatment well    Behavior During Therapy [facility] for tasks assessed/performed           Past Medical History:  Diagnosis Date  . Allergy   . Encounter for care of pacemaker 08/08/2019  . Hyperlipidemia   . Hypertension   . Pacemaker St Jude dual chamber MRI Assurity Dr-Rf - [id number] on 03/05/19 03/05/2019  . Third degree AV block (HCC) 03/04/2019    Past Surgical History:  Procedure Laterality Date  . CHOLECYSTECTOMY  2004  . EYE SURGERY    . INNER EAR SURGERY  2004  . PACEMAKER IMPLANT N/A 03/05/2019   Procedure: PACEMAKER IMPLANT;  Surgeon: Marinus Maw, MD;  Location: [facility] INVASIVE CV LAB;  Service: Cardiovascular;  Laterality: N/A;    There were no vitals filed for this visit.   Subjective Assessment - 08/25/20 1013    Subjective Patient reports she is feeling better with "little" pain along the posterior Rt lower leg.    Pertinent History pacemaker,  RT and Lt side injection    How long can you sit comfortably? "sitting no trouble"    How long can you stand comfortably? standing is ok,but bending hurts    How long can you walk comfortably? walking fast causes pain.    Currently in Pain? Yes    Pain Score 1     Pain Location Leg    Pain Orientation Right    Pain Descriptors / Indicators Aching    Pain Type Chronic pain    Pain Onset More  than a month ago              [facility] PT Assessment - 08/25/20 0001      Observation/Other Assessments   Focus on Therapeutic Outcomes (FOTO)  52% function                         [facility] Adult PT Treatment/Exercise - 08/25/20 0001      Self-Care   Other Self-Care Comments  see patient education      Lumbar Exercises: Aerobic   Nustep 5 minutes UE/LE level 6      Lumbar Exercises: Machines for Strengthening   Leg Press 2 x 10; 40 lbs      Lumbar Exercises: Standing   Functional Squats 10 reps    Functional Squats Limitations to chair; 5lb kettlebell    Other Standing Lumbar Exercises standing march on airex 2 x 20    Other Standing Lumbar Exercises hip abduction and hip extension on airex 2  x10      Lumbar Exercises: Seated   Other Seated Lumbar Exercises pelvic tilt 2 x10                  PT  Education - 08/25/20 1019    Education Details FOTO score/progress    Person(s) Educated Patient    Methods Explanation;Demonstration    Comprehension Verbalized understanding            PT Short Term Goals - 08/11/20 1531      PT SHORT TERM GOAL #1   Title She will be indpendent with initial hEP.    Time 3    Period Weeks    Status Achieved      PT SHORT TERM GOAL #2   Title She will report pain decreased 25% or more    Baseline pain at worse 6-7/10 with bending and stairs; initial 9/10    Time 3    Period Weeks    Status Achieved      PT SHORT TERM GOAL #3   Title FOTO improved to  50%    Time 3    Period Weeks    Status Achieved      PT SHORT TERM GOAL #4   Title She will be able to flex 60 degrees before incr pain    Time 3    Period Weeks    Status Achieved             PT Long Term Goals - 08/11/20 1534      PT LONG TERM GOAL #1   Title She will be independent with all hEp issued    Time 6    Period Weeks    Status On-going      PT LONG TERM GOAL #2   Title She will report her pain as  intermittant and decreased 50% or  more    Baseline pain is intermittent at worst 6-7/10 with bending and stairs    Time 6    Period Weeks    Status On-going      PT LONG TERM GOAL #3   Title FOTO improved to  60%    Baseline 57%    Time 6    Period Weeks    Status On-going      PT LONG TERM GOAL #4   Title She will report able to done normal home tasks with min pain.    Baseline 2/10 with household tasks (cooking and and cleaning)    Time 6    Period Weeks    Status Achieved      PT LONG TERM GOAL #5   Title She will be able to sit fot 45-60 min before increased pain    Baseline patient reports ability to sit for 3-4 hours    Time 6    Period Weeks    Status Achieved                 Plan - 08/25/20 1017    Clinical Impression Statement Overall good tolerance to today's session without increased pain reported. Patient has slightly regressed in FOTO outcome score, though admits that her difficulty/inability to perform some functional tasks (lifting heavy items, completing heavy household activity) is not limited due to her back pain, but limited due to her pacemaker. Able to progress LE strengthening and functional activity with patient challenged with strengthening exercises on unstable surface. Squat form is gradually improving requiring minimal cues for appropriate knee alignment.    Personal Factors and Comorbidities Age;Time since onset of injury/illness/exacerbation;Past/Current Experience;Comorbidity 1    Comorbidities pacemaker    Examination-Activity Limitations Sit;Carry;Lift;Stairs    Examination-Participation Restrictions Cleaning;Meal Prep;Community Activity;Laundry    Stability/Clinical Decision Making Stable/Uncomplicated  Rehab Potential Good    PT Frequency --   1-2/week for 4 weeks   PT Duration 4 weeks    PT Treatment/Interventions Passive range of motion;Dry needling;Manual techniques;Patient/family education;Therapeutic exercise;Therapeutic activities;Moist Heat    PT Next Visit  Plan Update HEP. Progress core and hip strength in standing.NO Electric Stim  due to PACEMAKER   Pt speaks Hindi as language of choice so may need interpreter if instructions are not clear to her    PT Home Exercise Plan Access Code [email or web address]    Consulted and Agree with Plan of Care Patient           Patient will benefit from skilled therapeutic intervention in order to improve the following deficits and impairments:  Pain,Postural dysfunction,Decreased activity tolerance,Increased muscle spasms,Decreased range of motion,Decreased strength  Visit Diagnosis: Chronic bilateral low back pain, unspecified whether sciatica present  Muscle spasm of back  Abnormal posture  Joint stiffness of spine     Problem List Patient Active Problem List   Diagnosis Date Noted  . Stable angina (HCC) 08/14/2019  . Abnormal stress test 08/14/2019  . Encounter for care of pacemaker 08/08/2019  . Atypical chest pain 08/08/2019  . Pacemaker St Jude dual chamber MRI Assurity Dr-Rf - [id number] on 03/05/19 03/05/2019  . Third degree AV block (HCC) 03/04/2019  . Bradycardia 03/04/2019  . Dizziness 03/04/2019  . Syncope and collapse 03/04/2019  . Exertional dyspnea 03/04/2019  . Mixed hyperlipidemia 03/04/2019  . Essential hypertension 03/04/2019   [name], PT, DPT, ATC 08/25/20 10:58 AM [facility] [address] [place], [place], [address] Phone: [phone]   Fax:  [phone]  Name: Michelle Moran MRN: [id number] Date of Birth: [birth year]

## 2020-08-26 ENCOUNTER — Ambulatory Visit: Payer: Medicare Other

## 2020-08-26 DIAGNOSIS — M545 Low back pain, unspecified: Secondary | ICD-10-CM

## 2020-08-26 DIAGNOSIS — M2560 Stiffness of unspecified joint, not elsewhere classified: Secondary | ICD-10-CM

## 2020-08-26 DIAGNOSIS — R293 Abnormal posture: Secondary | ICD-10-CM

## 2020-08-26 DIAGNOSIS — M62830 Muscle spasm of back: Secondary | ICD-10-CM

## 2020-08-26 NOTE — Therapy (Signed)
[facility] Outpatient Rehabilitation [employer] [address] [place], [place], [address] Phone: [phone]   Fax:  [phone]  Physical Therapy Treatment  Patient Details  Name: Michelle Moran MRN: [id number] Date of Birth: 04/01/1956 Referring Provider (PT): Teryl Lucy, MD   Encounter Date: 08/26/2020   PT End of Session - 08/26/20 1236    Visit Number 11    Number of Visits 16    Date for PT Re-Evaluation 09/11/20    Authorization Type UHCMCR    Progress Note Due on Visit 18    PT Start Time 1232    PT Stop Time 1313    PT Time Calculation (min) 41 min    Activity Tolerance Patient tolerated treatment well    Behavior During Therapy [facility] for tasks assessed/performed           Past Medical History:  Diagnosis Date  . Allergy   . Encounter for care of pacemaker 08/08/2019  . Hyperlipidemia   . Hypertension   . Pacemaker St Jude dual chamber MRI Assurity Dr-Rf - [id number] on 03/05/19 03/05/2019  . Third degree AV block (HCC) 03/04/2019    Past Surgical History:  Procedure Laterality Date  . CHOLECYSTECTOMY  2004  . EYE SURGERY    . INNER EAR SURGERY  2004  . PACEMAKER IMPLANT N/A 03/05/2019   Procedure: PACEMAKER IMPLANT;  Surgeon: Marinus Maw, MD;  Location: [facility] INVASIVE CV LAB;  Service: Cardiovascular;  Laterality: N/A;    There were no vitals filed for this visit.   Subjective Assessment - 08/26/20 1234    Subjective "Little pain here." (points to Rt buttock). She reports she was tired after last session, though no pain.    Pertinent History pacemaker,  RT and Lt side injection    How long can you sit comfortably? "sitting no trouble"    How long can you stand comfortably? standing is ok,but bending hurts    How long can you walk comfortably? walking fast causes pain.    Currently in Pain? Yes    Pain Score 1     Pain Location Buttocks    Pain Orientation Right    Pain Descriptors / Indicators Aching    Pain Type Chronic pain     Pain Onset More than a month ago                             [facility] Adult PT Treatment/Exercise - 08/26/20 0001      Lumbar Exercises: Aerobic   Nustep 5 minutes UE/LE level 7      Lumbar Exercises: Machines for Strengthening   Cybex Knee Extension 2 x 10; 15 lbs    Cybex Knee Flexion 2 x 10; 20 lbs      Lumbar Exercises: Standing   Functional Squats 10 reps    Functional Squats Limitations x2; to chair, 5lb kettlebell    Other Standing Lumbar Exercises step ups 6 inch 2 x 10; single UE support      Lumbar Exercises: Seated   Other Seated Lumbar Exercises pelvic tilt 2 x10      Lumbar Exercises: Supine   Dead Bug 10 reps    Dead Bug Limitations x2; LE only                    PT Short Term Goals - 08/11/20 1531      PT SHORT TERM GOAL #1   Title She  will be indpendent with initial hEP.    Time 3    Period Weeks    Status Achieved      PT SHORT TERM GOAL #2   Title She will report pain decreased 25% or more    Baseline pain at worse 6-7/10 with bending and stairs; initial 9/10    Time 3    Period Weeks    Status Achieved      PT SHORT TERM GOAL #3   Title FOTO improved to  50%    Time 3    Period Weeks    Status Achieved      PT SHORT TERM GOAL #4   Title She will be able to flex 60 degrees before incr pain    Time 3    Period Weeks    Status Achieved             PT Long Term Goals - 08/11/20 1534      PT LONG TERM GOAL #1   Title She will be independent with all hEp issued    Time 6    Period Weeks    Status On-going      PT LONG TERM GOAL #2   Title She will report her pain as  intermittant and decreased 50% or more    Baseline pain is intermittent at worst 6-7/10 with bending and stairs    Time 6    Period Weeks    Status On-going      PT LONG TERM GOAL #3   Title FOTO improved to  60%    Baseline 57%    Time 6    Period Weeks    Status On-going      PT LONG TERM GOAL #4   Title She will report able to  done normal home tasks with min pain.    Baseline 2/10 with household tasks (cooking and and cleaning)    Time 6    Period Weeks    Status Achieved      PT LONG TERM GOAL #5   Title She will be able to sit fot 45-60 min before increased pain    Baseline patient reports ability to sit for 3-4 hours    Time 6    Period Weeks    Status Achieved                 Plan - 08/26/20 1250    Clinical Impression Statement Able to progress functional activity including squatting and step-ups and BLE strengthening without increased reports of pain. Patient demonstrates Rt lateral weight shift and difficulty controlling descent during squats to chair with moderate ability to correct weight shift with cueing. She requires continued emphasis on improving body mechanics with functional tasks to reduce overall stress on her low back with her daily household activities.    Personal Factors and Comorbidities Age;Time since onset of injury/illness/exacerbation;Past/Current Experience;Comorbidity 1    Comorbidities pacemaker    Examination-Activity Limitations Sit;Carry;Lift;Stairs    Examination-Participation Restrictions Cleaning;Meal Prep;Community Activity;Laundry    Stability/Clinical Decision Making Stable/Uncomplicated    Rehab Potential Good    PT Frequency --   1-2/week for 4 weeks   PT Duration 4 weeks    PT Treatment/Interventions Passive range of motion;Dry needling;Manual techniques;Patient/family education;Therapeutic exercise;Therapeutic activities;Moist Heat    PT Next Visit Plan Progress core and hip strength in standing. step downs, squatting, hip hinge, golfers lift. NO Electric Stim  due to PACEMAKER   Pt speaks Hindi as language  of choice so may need interpreter if instructions are not clear to her    PT Home Exercise Plan Access Code [email or web address]    Consulted and Agree with Plan of Care Patient           Patient will benefit from skilled therapeutic intervention in order to  improve the following deficits and impairments:  Pain,Postural dysfunction,Decreased activity tolerance,Increased muscle spasms,Decreased range of motion,Decreased strength  Visit Diagnosis: Chronic bilateral low back pain, unspecified whether sciatica present  Muscle spasm of back  Abnormal posture  Joint stiffness of spine     Problem List Patient Active Problem List   Diagnosis Date Noted  . Stable angina (HCC) 08/14/2019  . Abnormal stress test 08/14/2019  . Encounter for care of pacemaker 08/08/2019  . Atypical chest pain 08/08/2019  . Pacemaker St Jude dual chamber MRI Assurity Dr-Rf - [id number] on 03/05/19 03/05/2019  . Third degree AV block (HCC) 03/04/2019  . Bradycardia 03/04/2019  . Dizziness 03/04/2019  . Syncope and collapse 03/04/2019  . Exertional dyspnea 03/04/2019  . Mixed hyperlipidemia 03/04/2019  . Essential hypertension 03/04/2019   [name], PT, DPT, ATC 08/26/20 1:15 PM  [facility] [address] [place], [place], [address] Phone: [phone]   Fax:  [phone]  Name: Michelle Moran MRN: [id number] Date of Birth: 21-Nov-1955

## 2020-09-03 ENCOUNTER — Ambulatory Visit: Payer: Medicare Other | Attending: Orthopedic Surgery

## 2020-09-03 ENCOUNTER — Other Ambulatory Visit: Payer: Self-pay

## 2020-09-03 DIAGNOSIS — R293 Abnormal posture: Secondary | ICD-10-CM | POA: Diagnosis present

## 2020-09-03 DIAGNOSIS — M2560 Stiffness of unspecified joint, not elsewhere classified: Secondary | ICD-10-CM | POA: Diagnosis present

## 2020-09-03 DIAGNOSIS — M62830 Muscle spasm of back: Secondary | ICD-10-CM | POA: Insufficient documentation

## 2020-09-03 DIAGNOSIS — G8929 Other chronic pain: Secondary | ICD-10-CM | POA: Insufficient documentation

## 2020-09-03 DIAGNOSIS — M545 Low back pain, unspecified: Secondary | ICD-10-CM | POA: Diagnosis not present

## 2020-09-03 NOTE — Therapy (Signed)
[facility] [address] [place], [place], [address] Phone: [phone]   Fax:  [phone]  Physical Therapy Treatment  Patient Details  Name: Michelle Moran MRN: [id number] Date of Birth: 09-19-1955 Referring Provider (PT): Teryl Lucy, MD   Encounter Date: 09/03/2020   PT End of Session - 09/03/20 0831    Visit Number 12    Number of Visits 16    Date for PT Re-Evaluation 09/11/20    Authorization Type UHCMCR    Progress Note Due on Visit 18    PT Start Time 0830    PT Stop Time 0915    PT Time Calculation (min) 45 min    Activity Tolerance Patient tolerated treatment well    Behavior During Therapy [facility] for tasks assessed/performed           Past Medical History:  Diagnosis Date  . Allergy   . Encounter for care of pacemaker 08/08/2019  . Hyperlipidemia   . Hypertension   . Pacemaker St Jude dual chamber MRI Assurity Dr-Rf - [id number] on 03/05/19 03/05/2019  . Third degree AV block (HCC) 03/04/2019    Past Surgical History:  Procedure Laterality Date  . CHOLECYSTECTOMY  2004  . EYE SURGERY    . INNER EAR SURGERY  2004  . PACEMAKER IMPLANT N/A 03/05/2019   Procedure: PACEMAKER IMPLANT;  Surgeon: Marinus Maw, MD;  Location: [facility] INVASIVE CV LAB;  Service: Cardiovascular;  Laterality: N/A;    There were no vitals filed for this visit.   Subjective Assessment - 09/03/20 0835    Subjective A little pain rT buttock    Pain Score 1     Pain Location Buttocks    Pain Orientation Right    Pain Descriptors / Indicators Aching    Pain Type Chronic pain    Pain Radiating Towards to post RT thigh    Pain Onset More than a month ago    Pain Frequency Intermittent    Aggravating Factors  home task, bending,    Pain Relieving Factors cold , tylenol                             [facility] Adult PT Treatment/Exercise - 09/03/20 0001      Lumbar Exercises: Stretches   Single Knee to Chest Stretch  Right;Left;2 reps;20 seconds      Lumbar Exercises: Aerobic   Nustep 6 minutes UE/LE level 7      Lumbar Exercises: Machines for Strengthening   Cybex Knee Extension 2 x 10; 15 lbs    Cybex Knee Flexion 2 x 10; 20 lbs      Lumbar Exercises: Standing   Functional Squats 10 reps    Functional Squats Limitations x2; to chair, 5lb kettlebell    Other Standing Lumbar Exercises step ups 6 inch 2 x 10; single UE support      Lumbar Exercises: Seated   Other Seated Lumbar Exercises pelvic tilt 2 x10      Lumbar Exercises: Supine   Dead Bug 10 reps    Dead Bug Limitations x2; LE only                    PT Short Term Goals - 08/11/20 1531      PT SHORT TERM GOAL #1   Title She will be indpendent with initial hEP.    Time 3    Period Weeks  Status Achieved      PT SHORT TERM GOAL #2   Title She will report pain decreased 25% or more    Baseline pain at worse 6-7/10 with bending and stairs; initial 9/10    Time 3    Period Weeks    Status Achieved      PT SHORT TERM GOAL #3   Title FOTO improved to  50%    Time 3    Period Weeks    Status Achieved      PT SHORT TERM GOAL #4   Title She will be able to flex 60 degrees before incr pain    Time 3    Period Weeks    Status Achieved             PT Long Term Goals - 08/11/20 1534      PT LONG TERM GOAL #1   Title She will be independent with all hEp issued    Time 6    Period Weeks    Status On-going      PT LONG TERM GOAL #2   Title She will report her pain as  intermittant and decreased 50% or more    Baseline pain is intermittent at worst 6-7/10 with bending and stairs    Time 6    Period Weeks    Status On-going      PT LONG TERM GOAL #3   Title FOTO improved to  60%    Baseline 57%    Time 6    Period Weeks    Status On-going      PT LONG TERM GOAL #4   Title She will report able to done normal home tasks with min pain.    Baseline 2/10 with household tasks (cooking and and cleaning)     Time 6    Period Weeks    Status Achieved      PT LONG TERM GOAL #5   Title She will be able to sit fot 45-60 min before increased pain    Baseline patient reports ability to sit for 3-4 hours    Time 6    Period Weeks    Status Achieved                 Plan - 09/03/20 3810    PT Treatment/Interventions Passive range of motion;Dry needling;Manual techniques;Patient/family education;Therapeutic exercise;Therapeutic activities;Moist Heat    PT Next Visit Plan Progress core and hip strength in standing. step downs, squatting, hip hinge, golfers lift. NO Electric Stim  due to PACEMAKER   Pt speaks Hindi as language of choice so may need interpreter if instructions are not clear to her    PT Home Exercise Plan Access Code [email or web address]    Consulted and Agree with Plan of Care Patient           Patient will benefit from skilled therapeutic intervention in order to improve the following deficits and impairments:  Pain,Postural dysfunction,Decreased activity tolerance,Increased muscle spasms,Decreased range of motion,Decreased strength  Visit Diagnosis: Chronic bilateral low back pain, unspecified whether sciatica present  Muscle spasm of back  Abnormal posture  Joint stiffness of spine     Problem List Patient Active Problem List   Diagnosis Date Noted  . Stable angina (HCC) 08/14/2019  . Abnormal stress test 08/14/2019  . Encounter for care of pacemaker 08/08/2019  . Atypical chest pain 08/08/2019  . Pacemaker St Jude dual chamber MRI Assurity Dr-Rf - [id number] on 03/05/19 03/05/2019  .  Third degree AV block (HCC) 03/04/2019  . Bradycardia 03/04/2019  . Dizziness 03/04/2019  . Syncope and collapse 03/04/2019  . Exertional dyspnea 03/04/2019  . Mixed hyperlipidemia 03/04/2019  . Essential hypertension 03/04/2019    Caprice Red  PT  09/03/2020, 9:10 AM  [facility] [address] [place], [place], [address] Phone:  [phone]   Fax:  [phone]  Name: Michelle Moran MRN: [id number] Date of Birth: Apr 04, 1956

## 2020-09-08 ENCOUNTER — Ambulatory Visit: Payer: Medicare Other

## 2020-09-08 ENCOUNTER — Other Ambulatory Visit: Payer: Self-pay

## 2020-09-08 DIAGNOSIS — R293 Abnormal posture: Secondary | ICD-10-CM

## 2020-09-08 DIAGNOSIS — G8929 Other chronic pain: Secondary | ICD-10-CM

## 2020-09-08 DIAGNOSIS — M62830 Muscle spasm of back: Secondary | ICD-10-CM

## 2020-09-08 DIAGNOSIS — M2560 Stiffness of unspecified joint, not elsewhere classified: Secondary | ICD-10-CM

## 2020-09-08 DIAGNOSIS — M545 Low back pain, unspecified: Secondary | ICD-10-CM

## 2020-09-08 NOTE — Therapy (Signed)
[facility]patient [facility] [address] [place], [place], [address] Phone: [phone]   Fax:  [phone]  Physical Therapy Treatment/Re-evaluation  Patient Details  Name: Michelle Moran MRN: [id number] Date of Birth: 1956-05-04 Referring Provider (PT): Teryl Lucy, MD   Encounter Date: 09/08/2020   PT End of Session - 09/08/20 1043    Visit Number 13    Number of Visits 21    Date for PT Re-Evaluation 10/09/20    Authorization Type UHC MCR    Progress Note Due on Visit 18    PT Start Time 1045    PT Stop Time 1125    PT Time Calculation (min) 40 min    Activity Tolerance Patient tolerated treatment well    Behavior During Therapy [facility] for tasks assessed/performed           Past Medical History:  Diagnosis Date  . Allergy   . Encounter for care of pacemaker 08/08/2019  . Hyperlipidemia   . Hypertension   . Pacemaker St Jude dual chamber MRI Assurity Dr-Rf - [id number] on 03/05/19 03/05/2019  . Third degree AV block (HCC) 03/04/2019    Past Surgical History:  Procedure Laterality Date  . CHOLECYSTECTOMY  2004  . EYE SURGERY    . INNER EAR SURGERY  2004  . PACEMAKER IMPLANT N/A 03/05/2019   Procedure: PACEMAKER IMPLANT;  Surgeon: Marinus Maw, MD;  Location: [facility] INVASIVE CV LAB;  Service: Cardiovascular;  Laterality: N/A;    There were no vitals filed for this visit.   Subjective Assessment - 09/08/20 1043    Subjective "Feel little bit good after last time but been one week now. Little pain Saturday and Sunday. Now not bad, maybe 1.2/10 in same place."    Pertinent History pacemaker,  RT and Lt side injection    Limitations House hold activities   reaching overhead nad lifting/   How long can you sit comfortably? "sitting no trouble"    How long can you stand comfortably? standing is ok,but bending hurts    How long can you walk comfortably? walking fast causes pain.    Currently in Pain? Yes    Pain Score 1    "1.2"    Pain Location Buttocks    Pain Orientation Right    Pain Descriptors / Indicators Aching    Pain Type Chronic pain    Pain Onset More than a month ago              [facility] PT Assessment - 09/08/20 0001      Assessment   Medical Diagnosis LBP    Referring Provider (PT) Teryl Lucy, MD      AROM   Lumbar Flexion can touch fingertips to toes but with LBP    Lumbar Extension WFL no pain    Lumbar - Right Side Bend fingertip to lateral joint line    Lumbar - Left Side Bend fingertip to lateral joint line    Lumbar - Right Rotation WFL    Lumbar - Left Rotation [facility]      Strength   Overall Strength Comments BLE strength 4+/5 grossly except hip ABD 4/5                         [facility] Adult PT Treatment/Exercise - 09/08/20 0001      Lumbar Exercises: Stretches   Piriformis Stretch Right;1 rep;60 seconds      Lumbar Exercises: Aerobic   Nustep  5 minutes UE/LE level 7      Lumbar Exercises: Machines for Strengthening   Cybex Knee Extension 2 x 15; 20 lbs    Cybex Knee Flexion 2 x 15; 25 lbs      Lumbar Exercises: Standing   Functional Squats 10 reps    Functional Squats Limitations x2; to chair, 5lb kettlebell    Other Standing Lumbar Exercises step ups 6 inch 2 x 15; single UE support. then side steps x 10 each side with BUE support at freemotion      Lumbar Exercises: Supine   Dead Bug Limitations alternating marches with low back against mat x 30 (15x each LE)    Bridge with Ball Squeeze 10 reps    Other Supine Lumbar Exercises hips/knees at 90/90 hold 10 x 5-10 seconds                  PT Education - 09/08/20 1128    Education Details Reviewed HEP, objective findings, POC    Person(s) Educated Patient    Methods Explanation;Demonstration    Comprehension Verbalized understanding;Returned demonstration            PT Short Term Goals - 09/08/20 1128      PT SHORT TERM GOAL #1   Title She will be indpendent with initial hEP.    Time 3     Period Weeks    Status Achieved      PT SHORT TERM GOAL #2   Title She will report pain decreased 25% or more    Baseline pain at worse 6-7/10 with bending and stairs; initial 9/10    Time 3    Period Weeks    Status Achieved      PT SHORT TERM GOAL #3   Title FOTO improved to  50%    Time 3    Period Weeks    Status Achieved      PT SHORT TERM GOAL #4   Title She will be able to flex 60 degrees before incr pain    Time 3    Period Weeks    Status Achieved             PT Long Term Goals - 09/08/20 1128      PT LONG TERM GOAL #1   Title She will be independent with all hEp issued    Time 6    Period Weeks    Status On-going      PT LONG TERM GOAL #2   Title She will report her pain as  intermittant and decreased 50% or more    Baseline pain is intermittent at worst 6-7/10 with bending and stairs - pt is unable to use the bottom cupboards/cabinets at home    Time 6    Period Weeks    Status On-going      PT LONG TERM GOAL #3   Title FOTO improved to  60%    Baseline 57%    Time 6    Period Weeks    Status On-going      PT LONG TERM GOAL #4   Title She will report able to done normal home tasks with min pain.    Baseline 1-2/10 with household tasks (cooking and and cleaning) sometimes more but not always    Time 6    Period Weeks    Status Achieved      PT LONG TERM GOAL #5   Title She will be able to sit  fot 45-60 min before increased pain    Baseline patient reports ability to sit for 3-4 hours    Time 6    Period Weeks    Status Achieved      PT LONG TERM GOAL #6   Title Patient will be able to utilize bottom cupboards/cabinets at home with </= 2/10 low back pain.    Baseline currently has not been able to place items in bottom cabinets    Time 4    Period Weeks    Status New    Target Date 10/06/20                 Plan - 09/08/20 1044    Clinical Impression Statement Patient tolerated session well and was able to increase resistance  with cybex knee flexion and extension by 5# each without increased reports or pain. She continues to demonstrate difficulty with controlled descent during sit<>stand/squats and when descending from step that is more apparent when fatigue sets in after several repetitions. Her lumbar flexion AROM has improved, but she continues to have low back pain when bending, walking at regular pace, and navigating stairs. She expresses that she has been unable to use bottom cabinets/cupboards and has items on a shelf and higher cabinet secondary to her back pain. She should benefit from continued skilled PT intervention for 4 weeks to allow for improved body mechanics, core and LE strength, and ability to perform functional tasks.    Personal Factors and Comorbidities Age;Time since onset of injury/illness/exacerbation;Past/Current Experience;Comorbidity 1    Comorbidities pacemaker    Examination-Activity Limitations Sit;Carry;Lift;Stairs    Examination-Participation Restrictions Cleaning;Meal Prep;Community Activity;Laundry    Stability/Clinical Decision Making Stable/Uncomplicated    Clinical Decision Making Moderate    PT Frequency --   1-2 week/4 weeks   PT Duration 4 weeks    PT Treatment/Interventions Passive range of motion;Dry needling;Manual techniques;Patient/family education;Therapeutic exercise;Therapeutic activities;Moist Heat;Stair training;Functional mobility training;Neuromuscular re-education    PT Next Visit Plan Progress core and hip strength in standing. step downs, squatting, hip hinge, deadlift golfers lift. NO Electric Stim  due to PACEMAKER   Pt speaks Hindi as language of choice so may need interpreter if instructions are not clear to her    PT Home Exercise Plan Access Code [email or web address]    Consulted and Agree with Plan of Care Patient           Patient will benefit from skilled therapeutic intervention in order to improve the following deficits and impairments:  Pain,Postural  dysfunction,Decreased activity tolerance,Increased muscle spasms,Decreased range of motion,Decreased strength  Visit Diagnosis: Chronic bilateral low back pain, unspecified whether sciatica present  Muscle spasm of back  Abnormal posture  Joint stiffness of spine     Problem List Patient Active Problem List   Diagnosis Date Noted  . Stable angina (HCC) 08/14/2019  . Abnormal stress test 08/14/2019  . Encounter for care of pacemaker 08/08/2019  . Atypical chest pain 08/08/2019  . Pacemaker St Jude dual chamber MRI Assurity Dr-Rf - [id number] on 03/05/19 03/05/2019  . Third degree AV block (HCC) 03/04/2019  . Bradycardia 03/04/2019  . Dizziness 03/04/2019  . Syncope and collapse 03/04/2019  . Exertional dyspnea 03/04/2019  . Mixed hyperlipidemia 03/04/2019  . Essential hypertension 03/04/2019      Rhea Bleacher, PT, DPT 09/08/20 11:38 AM  [facility] [address] [place], [place], [address] Phone: [phone]   Fax:  [phone]  Name: Angy Swearengin MRN: [id number] Date  of Birth: 1955/07/27

## 2020-09-09 ENCOUNTER — Ambulatory Visit: Payer: Medicare Other

## 2020-09-09 DIAGNOSIS — M2560 Stiffness of unspecified joint, not elsewhere classified: Secondary | ICD-10-CM

## 2020-09-09 DIAGNOSIS — M545 Low back pain, unspecified: Secondary | ICD-10-CM | POA: Diagnosis not present

## 2020-09-09 DIAGNOSIS — G8929 Other chronic pain: Secondary | ICD-10-CM

## 2020-09-09 DIAGNOSIS — R293 Abnormal posture: Secondary | ICD-10-CM

## 2020-09-09 DIAGNOSIS — M62830 Muscle spasm of back: Secondary | ICD-10-CM

## 2020-09-09 NOTE — Therapy (Signed)
[facility] [address] [place], [place], [address] Phone: [phone]   Fax:  [phone]  Physical Therapy Treatment  Patient Details  Name: Michelle Moran MRN: [id number] Date of Birth: 30-Sep-1955 Referring Provider (PT): Teryl Lucy, MD   Encounter Date: 09/09/2020   PT End of Session - 09/09/20 1740    Visit Number 14    Number of Visits 21    Date for PT Re-Evaluation 10/09/20    Authorization Type UHC MCR    Progress Note Due on Visit 18    PT Start Time 1742    PT Stop Time 1825    PT Time Calculation (min) 43 min    Activity Tolerance Patient tolerated treatment well    Behavior During Therapy [facility] for tasks assessed/performed           Past Medical History:  Diagnosis Date  . Allergy   . Encounter for care of pacemaker 08/08/2019  . Hyperlipidemia   . Hypertension   . Pacemaker St Jude dual chamber MRI Assurity Dr-Rf - [id number] on 03/05/19 03/05/2019  . Third degree AV block (HCC) 03/04/2019    Past Surgical History:  Procedure Laterality Date  . CHOLECYSTECTOMY  2004  . EYE SURGERY    . INNER EAR SURGERY  2004  . PACEMAKER IMPLANT N/A 03/05/2019   Procedure: PACEMAKER IMPLANT;  Surgeon: Marinus Maw, MD;  Location: [facility] INVASIVE CV LAB;  Service: Cardiovascular;  Laterality: N/A;    There were no vitals filed for this visit.   Subjective Assessment - 09/09/20 1740    Subjective "Feel good. Little pain maybe 0.25/10"    Pertinent History pacemaker,  RT and Lt side injection    Limitations House hold activities   reaching overhead nad lifting/   How long can you sit comfortably? "sitting no trouble"    How long can you stand comfortably? standing is ok,but bending hurts    How long can you walk comfortably? walking fast causes pain.    Currently in Pain? Yes    Pain Score 1    0.25/10   Pain Location Buttocks    Pain Orientation Right    Pain Descriptors / Indicators Aching    Pain Type  Chronic pain    Pain Onset More than a month ago              [facility] PT Assessment - 09/09/20 0001      Assessment   Medical Diagnosis LBP    Referring Provider (PT) Teryl Lucy, MD                         [facility] Adult PT Treatment/Exercise - 09/09/20 0001      Lumbar Exercises: Aerobic   Nustep 5 minutes UE/LE level 7      Lumbar Exercises: Machines for Strengthening   Cybex Knee Extension 2 x 15; 25 lbs    Cybex Knee Flexion 2 x 15; 25 lbs      Lumbar Exercises: Standing   Functional Squats 10 reps    Functional Squats Limitations x2; to chair, 10 lb kettlebell    Other Standing Lumbar Exercises pallof press 2 green bands with LUE closer to band attachment then not performed facing opposite direction due to discomfort in L shoulder with initial attempt    Other Standing Lumbar Exercises hip hinge with dowel x 20      Lumbar Exercises: Seated   Hip Flexion on  Ball Strengthening;Both;10 reps   10x each LE   Other Seated Lumbar Exercises PPT 2 x 10 while seated on green swiss ball    Other Seated Lumbar Exercises hip hinge with dowel x 20      Lumbar Exercises: Supine   Dead Bug Limitations alternating marches with low back against mat x 30 (15x each LE)    Bridge with [employer] 20 reps    Bridge with [employer] Limitations 2 x 10    Other Supine Lumbar Exercises hips/knees at 90/90 hold 10 x 5-10 seconds                    PT Short Term Goals - 09/08/20 1128      PT SHORT TERM GOAL #1   Title She will be indpendent with initial hEP.    Time 3    Period Weeks    Status Achieved      PT SHORT TERM GOAL #2   Title She will report pain decreased 25% or more    Baseline pain at worse 6-7/10 with bending and stairs; initial 9/10    Time 3    Period Weeks    Status Achieved      PT SHORT TERM GOAL #3   Title FOTO improved to  50%    Time 3    Period Weeks    Status Achieved      PT SHORT TERM GOAL #4   Title She will be able to  flex 60 degrees before incr pain    Time 3    Period Weeks    Status Achieved             PT Long Term Goals - 09/08/20 1128      PT LONG TERM GOAL #1   Title She will be independent with all hEp issued    Time 6    Period Weeks    Status On-going      PT LONG TERM GOAL #2   Title She will report her pain as  intermittant and decreased 50% or more    Baseline pain is intermittent at worst 6-7/10 with bending and stairs - pt is unable to use the bottom cupboards/cabinets at home    Time 6    Period Weeks    Status On-going      PT LONG TERM GOAL #3   Title FOTO improved to  60%    Baseline 57%    Time 6    Period Weeks    Status On-going      PT LONG TERM GOAL #4   Title She will report able to done normal home tasks with min pain.    Baseline 1-2/10 with household tasks (cooking and and cleaning) sometimes more but not always    Time 6    Period Weeks    Status Achieved      PT LONG TERM GOAL #5   Title She will be able to sit fot 45-60 min before increased pain    Baseline patient reports ability to sit for 3-4 hours    Time 6    Period Weeks    Status Achieved      PT LONG TERM GOAL #6   Title Patient will be able to utilize bottom cupboards/cabinets at home with </= 2/10 low back pain.    Baseline currently has not been able to place items in bottom cabinets    Time 4  Period Weeks    Status New    Target Date 10/06/20                 Plan - 09/09/20 1741    Clinical Impression Statement Patient tolerated session well with no adverse effects or complaints of pain. She continues to progress well with resisted LE and core strengthening exercises. She had difficulty holding 90/90 hip and knee FL (tabletop position) with core contraction > 5 seconds today but was able to perform with cues and very brief rest breaks between repetitions. Verbal and tactile cues provided during seated and standing hip hinges for technique. She expresses occasional  radicular symptoms that do not radiate to/below R knee. She should continue to benefit from skilled PT intervention to allow for improved functional mobility.    Personal Factors and Comorbidities Age;Time since onset of injury/illness/exacerbation;Past/Current Experience;Comorbidity 1    Comorbidities pacemaker    Examination-Activity Limitations Sit;Carry;Lift;Stairs    Examination-Participation Restrictions Cleaning;Meal Prep;Community Activity;Laundry    Stability/Clinical Decision Making Stable/Uncomplicated    PT Frequency --   1-2 week/4 weeks   PT Duration 4 weeks    PT Treatment/Interventions Passive range of motion;Dry needling;Manual techniques;Patient/family education;Therapeutic exercise;Therapeutic activities;Moist Heat;Stair training;Functional mobility training;Neuromuscular re-education    PT Next Visit Plan Progress core and hip strength in standing. step downs, squatting, hip hinge, deadlift golfers lift. NO Electric Stim  due to PACEMAKER   Pt speaks Hindi as language of choice so may need interpreter if instructions are not clear to her    PT Home Exercise Plan Access Code [email or web address]    Consulted and Agree with Plan of Care Patient           Patient will benefit from skilled therapeutic intervention in order to improve the following deficits and impairments:  Pain,Postural dysfunction,Decreased activity tolerance,Increased muscle spasms,Decreased range of motion,Decreased strength  Visit Diagnosis: Chronic bilateral low back pain, unspecified whether sciatica present  Muscle spasm of back  Abnormal posture  Joint stiffness of spine     Problem List Patient Active Problem List   Diagnosis Date Noted  . Stable angina (HCC) 08/14/2019  . Abnormal stress test 08/14/2019  . Encounter for care of pacemaker 08/08/2019  . Atypical chest pain 08/08/2019  . Pacemaker St Jude dual chamber MRI Assurity Dr-Rf - [id number] on 03/05/19 03/05/2019  . Third degree AV block (HCC)  03/04/2019  . Bradycardia 03/04/2019  . Dizziness 03/04/2019  . Syncope and collapse 03/04/2019  . Exertional dyspnea 03/04/2019  . Mixed hyperlipidemia 03/04/2019  . Essential hypertension 03/04/2019      Rhea Bleacher, PT, DPT 09/09/20 6:52 PM  [facility] [address] [place], [place], [address] Phone: [phone]   Fax:  [phone]  Name: Iara Monds MRN: [id number] Date of Birth: [birth year]

## 2020-09-15 ENCOUNTER — Other Ambulatory Visit: Payer: Self-pay

## 2020-09-15 ENCOUNTER — Ambulatory Visit: Payer: Medicare Other

## 2020-09-15 DIAGNOSIS — M545 Low back pain, unspecified: Secondary | ICD-10-CM | POA: Diagnosis not present

## 2020-09-15 DIAGNOSIS — R293 Abnormal posture: Secondary | ICD-10-CM

## 2020-09-15 DIAGNOSIS — M62830 Muscle spasm of back: Secondary | ICD-10-CM

## 2020-09-15 DIAGNOSIS — M2560 Stiffness of unspecified joint, not elsewhere classified: Secondary | ICD-10-CM

## 2020-09-15 NOTE — Therapy (Signed)
[facility] Outpatient Rehabilitation [facility] [address] [place], [place], [address] Phone: [phone]   Fax:  [phone]  Physical Therapy Treatment  Patient Details  Name: Michelle Moran MRN: [id number] Date of Birth: 1956-03-25 Referring Provider (PT): Teryl Lucy, MD   Encounter Date: 09/15/2020   PT End of Session - 09/15/20 1551    Visit Number 15    Number of Visits 21    Date for PT Re-Evaluation 10/09/20    Authorization Type UHC MCR    Progress Note Due on Visit 18    PT Start Time 1547    PT Stop Time 1628    PT Time Calculation (min) 41 min    Activity Tolerance Patient tolerated treatment well    Behavior During Therapy [facility] for tasks assessed/performed           Past Medical History:  Diagnosis Date  . Allergy   . Encounter for care of pacemaker 08/08/2019  . Hyperlipidemia   . Hypertension   . Pacemaker St Jude dual chamber MRI Assurity Dr-Rf - [id number] on 03/05/19 03/05/2019  . Third degree AV block (HCC) 03/04/2019    Past Surgical History:  Procedure Laterality Date  . CHOLECYSTECTOMY  2004  . EYE SURGERY    . INNER EAR SURGERY  2004  . PACEMAKER IMPLANT N/A 03/05/2019   Procedure: PACEMAKER IMPLANT;  Surgeon: Marinus Maw, MD;  Location: [facility] INVASIVE CV LAB;  Service: Cardiovascular;  Laterality: N/A;    There were no vitals filed for this visit.   Subjective Assessment - 09/15/20 1550    Subjective Patient reports she is tired from her doctor appointment earlier today. She denies any pain currently.    Pertinent History pacemaker,  RT and Lt side injection    Limitations House hold activities   reaching overhead nad lifting/   How long can you sit comfortably? "sitting no trouble"    How long can you stand comfortably? standing is ok,but bending hurts    How long can you walk comfortably? walking fast causes pain.    Currently in Pain? No/denies    Pain Onset More than a month ago                              [facility] Adult PT Treatment/Exercise - 09/15/20 0001      Lumbar Exercises: Aerobic   Nustep 5 minutes UE/LE level 7      Lumbar Exercises: Seated   Other Seated Lumbar Exercises hip hinge with dowel 2 x 10      Lumbar Exercises: Supine   Bent Knee Raise 20 reps    Dead Bug 10 reps    Dead Bug Limitations x2; LE only    Bridge with clamshell 10 reps   x2 with green theraband   Straight Leg Raise 10 reps    Straight Leg Raises Limitations x2; 2 lbs      Lumbar Exercises: Sidelying   Hip Abduction 10 reps    Hip Abduction Weights (lbs) 2    Hip Abduction Limitations x2                    PT Short Term Goals - 09/08/20 1128      PT SHORT TERM GOAL #1   Title She will be indpendent with initial hEP.    Time 3    Period Weeks    Status Achieved  PT SHORT TERM GOAL #2   Title She will report pain decreased 25% or more    Baseline pain at worse 6-7/10 with bending and stairs; initial 9/10    Time 3    Period Weeks    Status Achieved      PT SHORT TERM GOAL #3   Title FOTO improved to  50%    Time 3    Period Weeks    Status Achieved      PT SHORT TERM GOAL #4   Title She will be able to flex 60 degrees before incr pain    Time 3    Period Weeks    Status Achieved             PT Long Term Goals - 09/08/20 1128      PT LONG TERM GOAL #1   Title She will be independent with all hEp issued    Time 6    Period Weeks    Status On-going      PT LONG TERM GOAL #2   Title She will report her pain as  intermittant and decreased 50% or more    Baseline pain is intermittent at worst 6-7/10 with bending and stairs - pt is unable to use the bottom cupboards/cabinets at home    Time 6    Period Weeks    Status On-going      PT LONG TERM GOAL #3   Title FOTO improved to  60%    Baseline 57%    Time 6    Period Weeks    Status On-going      PT LONG TERM GOAL #4   Title She will report able to done normal home  tasks with min pain.    Baseline 1-2/10 with household tasks (cooking and and cleaning) sometimes more but not always    Time 6    Period Weeks    Status Achieved      PT LONG TERM GOAL #5   Title She will be able to sit fot 45-60 min before increased pain    Baseline patient reports ability to sit for 3-4 hours    Time 6    Period Weeks    Status Achieved      PT LONG TERM GOAL #6   Title Patient will be able to utilize bottom cupboards/cabinets at home with </= 2/10 low back pain.    Baseline currently has not been able to place items in bottom cabinets    Time 4    Period Weeks    Status New    Target Date 10/06/20                 Plan - 09/15/20 1552    Clinical Impression Statement Patient tolerated session well today without reports of pain. Due to patient reporting fatigue upon arrival secondary to doctor's appointment with PCP this morning, session focused on core stabilzation and hip strengthening with plans to progress functional strengthening (squatting, lifting, stair negotiation) at next session when patient has better tolerance to higher level functional activity. Patient requires cues to breathe during dynamic core stablization. She is demonstrating overall improvements in core stablization and better mechanics with hip hinge.    Personal Factors and Comorbidities Age;Time since onset of injury/illness/exacerbation;Past/Current Experience;Comorbidity 1    Comorbidities pacemaker    Examination-Activity Limitations Sit;Carry;Lift;Stairs    Examination-Participation Restrictions Cleaning;Meal Prep;Community Activity;Laundry    Stability/Clinical Decision Making Stable/Uncomplicated    PT Frequency --  1-2 week/4 weeks   PT Duration 4 weeks    PT Treatment/Interventions Passive range of motion;Dry needling;Manual techniques;Patient/family education;Therapeutic exercise;Therapeutic activities;Moist Heat;Stair training;Functional mobility training;Neuromuscular  re-education    PT Next Visit Plan Progress core and hip strength in standing. step downs, squatting, hip hinge, deadlift golfers lift. NO Electric Stim  due to PACEMAKER   Pt speaks Hindi as language of choice so may need interpreter if instructions are not clear to her    PT Home Exercise Plan Access Code [email or web address]    Consulted and Agree with Plan of Care Patient           Patient will benefit from skilled therapeutic intervention in order to improve the following deficits and impairments:  Pain,Postural dysfunction,Decreased activity tolerance,Increased muscle spasms,Decreased range of motion,Decreased strength  Visit Diagnosis: Chronic bilateral low back pain, unspecified whether sciatica present  Muscle spasm of back  Abnormal posture  Joint stiffness of spine     Problem List Patient Active Problem List   Diagnosis Date Noted  . Stable angina (HCC) 08/14/2019  . Abnormal stress test 08/14/2019  . Encounter for care of pacemaker 08/08/2019  . Atypical chest pain 08/08/2019  . Pacemaker St Jude dual chamber MRI Assurity Dr-Rf - [id number] on 03/05/19 03/05/2019  . Third degree AV block (HCC) 03/04/2019  . Bradycardia 03/04/2019  . Dizziness 03/04/2019  . Syncope and collapse 03/04/2019  . Exertional dyspnea 03/04/2019  . Mixed hyperlipidemia 03/04/2019  . Essential hypertension 03/04/2019   [name], PT, DPT, ATC 09/15/20 4:31 PM [facility] Outpatient Rehabilitation [facility] [address] [place], [place], [address] Phone: [phone]   Fax:  [phone]  Name: Michelle Moran MRN: [id number] Date of Birth: [birth year]

## 2020-09-22 ENCOUNTER — Ambulatory Visit: Payer: Medicare Other

## 2020-09-22 ENCOUNTER — Other Ambulatory Visit: Payer: Self-pay

## 2020-09-22 DIAGNOSIS — M545 Low back pain, unspecified: Secondary | ICD-10-CM | POA: Diagnosis not present

## 2020-09-22 DIAGNOSIS — M2560 Stiffness of unspecified joint, not elsewhere classified: Secondary | ICD-10-CM

## 2020-09-22 DIAGNOSIS — G8929 Other chronic pain: Secondary | ICD-10-CM

## 2020-09-22 DIAGNOSIS — M62830 Muscle spasm of back: Secondary | ICD-10-CM

## 2020-09-22 DIAGNOSIS — R293 Abnormal posture: Secondary | ICD-10-CM

## 2020-09-22 NOTE — Therapy (Signed)
[facility] Outpatient Rehabilitation [facility] [address] [place], [place], [address] Phone: [phone]   Fax:  [phone]  Physical Therapy Treatment  Patient Details  Name: Michelle Moran MRN: [id number] Date of Birth: May 08, 1956 Referring Provider (PT): Teryl Lucy, MD   Encounter Date: 09/22/2020   PT End of Session - 09/22/20 1014    Visit Number 16    Number of Visits 21    Date for PT Re-Evaluation 10/09/20    Authorization Type UHC MCR    Progress Note Due on Visit 18    PT Start Time 1015    PT Stop Time 1058    PT Time Calculation (min) 43 min    Activity Tolerance Patient tolerated treatment well    Behavior During Therapy [facility] for tasks assessed/performed           Past Medical History:  Diagnosis Date  . Allergy   . Encounter for care of pacemaker 08/08/2019  . Hyperlipidemia   . Hypertension   . Pacemaker St Jude dual chamber MRI Assurity Dr-Rf - [id number] on 03/05/19 03/05/2019  . Third degree AV block (HCC) 03/04/2019    Past Surgical History:  Procedure Laterality Date  . CHOLECYSTECTOMY  2004  . EYE SURGERY    . INNER EAR SURGERY  2004  . PACEMAKER IMPLANT N/A 03/05/2019   Procedure: PACEMAKER IMPLANT;  Surgeon: Marinus Maw, MD;  Location: [facility] INVASIVE CV LAB;  Service: Cardiovascular;  Laterality: N/A;    There were no vitals filed for this visit.   Subjective Assessment - 09/22/20 1015    Subjective Patient reports she is feeling good today without reports of pain. She is completing her HEP.    Pertinent History pacemaker,  RT and Lt side injection    Limitations House hold activities   reaching overhead nad lifting/   How long can you sit comfortably? "sitting no trouble"    How long can you stand comfortably? standing is ok,but bending hurts    How long can you walk comfortably? walking fast causes pain.    Currently in Pain? No/denies    Pain Onset More than a month ago                              [facility] Adult PT Treatment/Exercise - 09/22/20 0001      Ambulation/Gait   Stairs Yes    Stairs Assistance 7: Independent    Height of Stairs 6   6 inch ascent 4 inch descent   Gait Comments pain during ascent on the RLE      Lumbar Exercises: Aerobic   Nustep 5 minutes UE/LE level 7      Lumbar Exercises: Standing   Other Standing Lumbar Exercises step ups to hurdle stance 2 x 10 each 6 inch    Other Standing Lumbar Exercises hip abduction and extension 2 x 10                    PT Short Term Goals - 09/08/20 1128      PT SHORT TERM GOAL #1   Title She will be indpendent with initial hEP.    Time 3    Period Weeks    Status Achieved      PT SHORT TERM GOAL #2   Title She will report pain decreased 25% or more    Baseline pain at worse 6-7/10 with bending and stairs; initial 9/10  Time 3    Period Weeks    Status Achieved      PT SHORT TERM GOAL #3   Title FOTO improved to  50%    Time 3    Period Weeks    Status Achieved      PT SHORT TERM GOAL #4   Title She will be able to flex 60 degrees before incr pain    Time 3    Period Weeks    Status Achieved             PT Long Term Goals - 09/08/20 1128      PT LONG TERM GOAL #1   Title She will be independent with all hEp issued    Time 6    Period Weeks    Status On-going      PT LONG TERM GOAL #2   Title She will report her pain as  intermittant and decreased 50% or more    Baseline pain is intermittent at worst 6-7/10 with bending and stairs - pt is unable to use the bottom cupboards/cabinets at home    Time 6    Period Weeks    Status On-going      PT LONG TERM GOAL #3   Title FOTO improved to  60%    Baseline 57%    Time 6    Period Weeks    Status On-going      PT LONG TERM GOAL #4   Title She will report able to done normal home tasks with min pain.    Baseline 1-2/10 with household tasks (cooking and and cleaning) sometimes more but not  always    Time 6    Period Weeks    Status Achieved      PT LONG TERM GOAL #5   Title She will be able to sit fot 45-60 min before increased pain    Baseline patient reports ability to sit for 3-4 hours    Time 6    Period Weeks    Status Achieved      PT LONG TERM GOAL #6   Title Patient will be able to utilize bottom cupboards/cabinets at home with </= 2/10 low back pain.    Baseline currently has not been able to place items in bottom cabinets    Time 4    Period Weeks    Status New    Target Date 10/06/20                 Plan - 09/22/20 1018    Clinical Impression Statement Focused on single leg stabilization as patient reports pain along Rt PSIS during stair ascent on the RLE and has notable hip drop when performing stair negotiation. Patient challenged with forward step ups to hurdle stance requiring verbal and tactile cues to maintain level pelvis. With continued practice patient able to properly perform reporting no pain when patient able to maintain pelvic stability. Patient has difficulty maintaining neutral trunk during standing hip abduction as she has tendency to demonstrate lateral trunk lean with moderate ability to correct independently. Patient requires continued focus on improving SL stability.    Personal Factors and Comorbidities Age;Time since onset of injury/illness/exacerbation;Past/Current Experience;Comorbidity 1    Comorbidities pacemaker    Examination-Activity Limitations Sit;Carry;Lift;Stairs    Examination-Participation Restrictions Cleaning;Meal Prep;Community Activity;Laundry    Stability/Clinical Decision Making Stable/Uncomplicated    PT Frequency --   1-2 week/4 weeks   PT Duration 4 weeks  PT Treatment/Interventions Passive range of motion;Dry needling;Manual techniques;Patient/family education;Therapeutic exercise;Therapeutic activities;Moist Heat;Stair training;Functional mobility training;Neuromuscular re-education    PT Next Visit Plan  Progress core and hip strength in standing. step downs, squatting, hip hinge, deadlift golfers lift. NO Electric Stim  due to PACEMAKER   Pt speaks Hindi as language of choice so may need interpreter if instructions are not clear to her    PT Home Exercise Plan Access Code [email or web address]    Consulted and Agree with Plan of Care Patient           Patient will benefit from skilled therapeutic intervention in order to improve the following deficits and impairments:  Pain,Postural dysfunction,Decreased activity tolerance,Increased muscle spasms,Decreased range of motion,Decreased strength  Visit Diagnosis: Chronic bilateral low back pain, unspecified whether sciatica present  Muscle spasm of back  Abnormal posture  Joint stiffness of spine     Problem List Patient Active Problem List   Diagnosis Date Noted  . Stable angina (HCC) 08/14/2019  . Abnormal stress test 08/14/2019  . Encounter for care of pacemaker 08/08/2019  . Atypical chest pain 08/08/2019  . Pacemaker St Jude dual chamber MRI Assurity Dr-Rf - [id number] on 03/05/19 03/05/2019  . Third degree AV block (HCC) 03/04/2019  . Bradycardia 03/04/2019  . Dizziness 03/04/2019  . Syncope and collapse 03/04/2019  . Exertional dyspnea 03/04/2019  . Mixed hyperlipidemia 03/04/2019  . Essential hypertension 03/04/2019   Michelle Moran, PT, DPT, ATC 09/22/20 10:59 AM  [facility] Outpatient Rehabilitation [facility] [address] [place], [place], [address] Phone: [phone]   Fax:  [phone]  Name: Katina Remick MRN: [id number] Date of Birth: 07/28/1955

## 2020-09-25 ENCOUNTER — Other Ambulatory Visit: Payer: Self-pay

## 2020-09-25 ENCOUNTER — Ambulatory Visit: Payer: Medicare Other

## 2020-09-25 DIAGNOSIS — M545 Low back pain, unspecified: Secondary | ICD-10-CM | POA: Diagnosis not present

## 2020-09-25 DIAGNOSIS — R293 Abnormal posture: Secondary | ICD-10-CM

## 2020-09-25 DIAGNOSIS — G8929 Other chronic pain: Secondary | ICD-10-CM

## 2020-09-25 DIAGNOSIS — M62830 Muscle spasm of back: Secondary | ICD-10-CM

## 2020-09-25 DIAGNOSIS — M2560 Stiffness of unspecified joint, not elsewhere classified: Secondary | ICD-10-CM

## 2020-09-25 NOTE — Therapy (Signed)
[place], [place], [address] Phone: [phone]   Fax:  [phone]  Physical Therapy Treatment Progress Note Reporting Period 08/11/20 to 09/25/20  See note below for Objective Data and Assessment of Progress/Goals.     Patient Details  Name: Michelle Moran MRN: [id number] Date of Birth: 26-Sep-1955 Referring Provider (PT): Marchia Bond, MD   Encounter Date: 09/25/2020   PT End of Session - 09/25/20 1009    Visit Number 17    Number of Visits 21    Date for PT Re-Evaluation 10/09/20    Authorization Type UHC MCR    Progress Note Due on Visit 27    PT Start Time 0945    PT Stop Time 1028    PT Time Calculation (min) 43 min    Activity Tolerance Patient tolerated treatment well    Behavior During Therapy [facility] for tasks assessed/performed           Past Medical History:  Diagnosis Date  . Allergy   . Encounter for care of pacemaker 08/08/2019  . Hyperlipidemia   . Hypertension   . Pacemaker St Jude dual chamber MRI Assurity Dr-Rf - [id number] on 03/05/19 03/05/2019  . Third degree AV block ([place]) 03/04/2019    Past Surgical History:  Procedure Laterality Date  . CHOLECYSTECTOMY  2004  . EYE SURGERY    . INNER EAR SURGERY  2004  . PACEMAKER IMPLANT N/A 03/05/2019   Procedure: PACEMAKER IMPLANT;  Surgeon: Evans Lance, MD;  Location: [facility] CV LAB;  Service: Cardiovascular;  Laterality: N/A;    There were no vitals filed for this visit.   Subjective Assessment - 09/25/20 0953    Subjective Patient reports she is feeling pretty good today without pain currently. She is still unable to reach into low cabinets at home and has more pain with stair negotiation.    Pertinent History pacemaker,  RT and Lt side injection    Limitations House hold activities   reaching overhead nad lifting/   How long can you sit comfortably? "sitting no trouble"    How long can you stand comfortably?  standing is ok,but bending hurts    How long can you walk comfortably? walking fast causes pain.    Currently in Pain? No/denies              [facility] PT Assessment - 09/25/20 0001      Observation/Other Assessments   Focus on Therapeutic Outcomes (FOTO)  60%                         [facility] Adult PT Treatment/Exercise - 09/25/20 0001      Self-Care   Other Self-Care Comments  see patient education      Therapeutic Activites    Therapeutic Activities ADL's    ADL's floor transfer training      Lumbar Exercises: Aerobic   Nustep 5 minutes UE/LE level 7      Lumbar Exercises: Standing   Other Standing Lumbar Exercises SL hurdle stance 3 x 10 sec each    Other Standing Lumbar Exercises hip abduction 2 x 10; hip extension 2 x 10      Lumbar Exercises: Supine   Dead Bug 10 reps    Dead Bug Limitations x2; LE only                  PT Education - 09/25/20 1124  Education Details FOTO score.    Person(s) Educated Patient    Methods Explanation    Comprehension Verbalized understanding            PT Short Term Goals - 09/08/20 1128      PT SHORT TERM GOAL #1   Title She will be indpendent with initial hEP.    Time 3    Period Weeks    Status Achieved      PT SHORT TERM GOAL #2   Title She will report pain decreased 25% or more    Baseline pain at worse 6-7/10 with bending and stairs; initial 9/10    Time 3    Period Weeks    Status Achieved      PT SHORT TERM GOAL #3   Title FOTO improved to  50%    Time 3    Period Weeks    Status Achieved      PT SHORT TERM GOAL #4   Title She will be able to flex 60 degrees before incr pain    Time 3    Period Weeks    Status Achieved             PT Long Term Goals - 09/25/20 1735      PT LONG TERM GOAL #1   Title She will be independent with all hEp issued    Time 6    Period Weeks    Status On-going      PT LONG TERM GOAL #2   Title She will report her pain as  intermittant and  decreased 50% or more    Baseline pain with stair negotiation at home 1-2/10; pt is unable to use the bottom cupboards/cabinets at home    Time 6    Period Weeks    Status On-going      PT LONG TERM GOAL #3   Title FOTO improved to  60%    Baseline 60%    Time 6    Period Weeks    Status Achieved      PT LONG TERM GOAL #4   Title She will report able to done normal home tasks with min pain.    Baseline 1-2/10 with household tasks (cooking and and cleaning) sometimes more but not always    Time 6    Period Weeks    Status Achieved      PT LONG TERM GOAL #5   Title She will be able to sit fot 45-60 min before increased pain    Baseline patient reports ability to sit for 3-4 hours    Time 6    Period Weeks    Status Achieved      PT LONG TERM GOAL #6   Title Patient will be able to utilize bottom cupboards/cabinets at home with </= 2/10 low back pain.    Baseline remains unable to place items in bottom cabinets; began teaching appropriate mechanics of floor transfer today    Time 4    Period Weeks    Status On-going                 Plan - 09/25/20 0956    Clinical Impression Statement Patient is making good progress towards her long term functional goals, having met established FOTO outcome goal at today's session. Began addressing appropriate mechanics for functional goal of reaching into low cabinets at home with patient able to perform floor transfer utilizing lunge technique with minimal low back pain.  It was recommended that patient begin utilizing this manuever to reach items at low height at home to reduce strain on her low back with patient verbalizing understanding and plans to begin practicing at home over the weekend. Continued focusing on SL stability with patient demonstrating improvement in maintaining lumbopelvic control compared to previous sessions, though continues to minimal pain when completing SL activity on the RLE    Personal Factors and Comorbidities  Age;Time since onset of injury/illness/exacerbation;Past/Current Experience;Comorbidity 1    Comorbidities pacemaker    Examination-Activity Limitations Sit;Carry;Lift;Stairs    Examination-Participation Restrictions Cleaning;Meal Prep;Community Activity;Laundry    Stability/Clinical Decision Making Stable/Uncomplicated    PT Frequency --   1-2 week/4 weeks   PT Duration 4 weeks    PT Treatment/Interventions Passive range of motion;Dry needling;Manual techniques;Patient/family education;Therapeutic exercise;Therapeutic activities;Moist Heat;Stair training;Functional mobility training;Neuromuscular re-education    PT Next Visit Plan Progress core and hip strength in standing. step downs, squatting, hip hinge, deadlift golfers lift. NO Electric Stim  due to PACEMAKER   Pt speaks Hindi as language of choice so may need interpreter if instructions are not clear to her    PT Home Exercise Plan Access Code [email or web address]    Consulted and Agree with Plan of Care Patient           Patient will benefit from skilled therapeutic intervention in order to improve the following deficits and impairments:  Pain,Postural dysfunction,Decreased activity tolerance,Increased muscle spasms,Decreased range of motion,Decreased strength  Visit Diagnosis: Chronic bilateral low back pain, unspecified whether sciatica present  Muscle spasm of back  Abnormal posture  Joint stiffness of spine     Problem List Patient Active Problem List   Diagnosis Date Noted  . Stable angina ([place]) 08/14/2019  . Abnormal stress test 08/14/2019  . Encounter for care of pacemaker 08/08/2019  . Atypical chest pain 08/08/2019  . Pacemaker St Jude dual chamber MRI Assurity Dr-Rf - [id number] on 03/05/19 03/05/2019  . Third degree AV block ([place]) 03/04/2019  . Bradycardia 03/04/2019  . Dizziness 03/04/2019  . Syncope and collapse 03/04/2019  . Exertional dyspnea 03/04/2019  . Mixed hyperlipidemia 03/04/2019  . Essential hypertension  03/04/2019  Gwendolyn Grant, PT, DPT, ATC 09/25/20 11:30 AM  [facility] [address] [place], [place], [address] Phone: [phone]   Fax:  [phone]  Name: Michelle Moran MRN: [id number] Date of Birth: 11/14/1955

## 2020-09-27 ENCOUNTER — Other Ambulatory Visit: Payer: Self-pay

## 2020-09-27 ENCOUNTER — Ambulatory Visit: Payer: Medicare Other

## 2020-09-27 DIAGNOSIS — R293 Abnormal posture: Secondary | ICD-10-CM

## 2020-09-27 DIAGNOSIS — M62830 Muscle spasm of back: Secondary | ICD-10-CM

## 2020-09-27 DIAGNOSIS — M545 Low back pain, unspecified: Secondary | ICD-10-CM

## 2020-09-27 DIAGNOSIS — M2560 Stiffness of unspecified joint, not elsewhere classified: Secondary | ICD-10-CM

## 2020-09-27 DIAGNOSIS — G8929 Other chronic pain: Secondary | ICD-10-CM

## 2020-09-27 NOTE — Therapy (Signed)
[place], [place], [address] Phone: [phone]   Fax:  [phone]  Physical Therapy Treatment/Discharge  Patient Details  Name: Michelle Moran MRN: [id number] Date of Birth: 02-Feb-1956 Referring Provider (PT): Marchia Bond, MD   Encounter Date: 09/27/2020   PT End of Session - 09/27/20 0931    Visit Number 18    Number of Visits 21    Date for PT Re-Evaluation 10/09/20    Authorization Type [employer] MCR    Progress Note Due on Visit 27    PT Start Time 0931    PT Stop Time 1012    PT Time Calculation (min) 41 min    Activity Tolerance Patient tolerated treatment well    Behavior During Therapy [facility] for tasks assessed/performed           Past Medical History:  Diagnosis Date  . Allergy   . Encounter for care of pacemaker 08/08/2019  . Hyperlipidemia   . Hypertension   . Pacemaker St Jude dual chamber MRI Assurity Dr-Rf - [id number] on 03/05/19 03/05/2019  . Third degree AV block ([place]) 03/04/2019    Past Surgical History:  Procedure Laterality Date  . CHOLECYSTECTOMY  2004  . EYE SURGERY    . INNER EAR SURGERY  2004  . PACEMAKER IMPLANT N/A 03/05/2019   Procedure: PACEMAKER IMPLANT;  Surgeon: Evans Lance, MD;  Location: [place] [facility];  Service: Cardiovascular;  Laterality: N/A;    There were no vitals filed for this visit.   Subjective Assessment - 09/27/20 0933    Subjective Patient reports she is doing well today without current complaints of pain. Began implementing lunge technique to reach low cabinets at home and reports this is easier than her previous way of completing, but still has pain a little pain in the low back when performing this movement.    Pertinent History pacemaker,  RT and Lt side injection    Limitations House hold activities   reaching overhead nad lifting/   How long can you sit comfortably? "sitting no trouble"    How long can you stand comfortably? about  30 minuutes    How long can you walk comfortably? walk 1.5 miles    Currently in Pain? No/denies              [facility] PT Assessment - 09/27/20 0001      Observation/Other Assessments   Focus on Therapeutic Outcomes (FOTO)  69% function      AROM   Lumbar Flexion can touch fingertips to toes but with LBP    Lumbar Extension WFL    Lumbar - Right Side Bend fingertip to lateral joint line    Lumbar - Left Side Bend fingertip to lateral joint line    Lumbar - Right Rotation WFL    Lumbar - Left Rotation [facility]      Strength   Overall Strength Comments BLE strength 5/5 with exception of hip extension 4+/5                         [facility] Adult PT Treatment/Exercise - 09/27/20 0001      Self-Care   Other Self-Care Comments  see patient education      Lumbar Exercises: Aerobic   Nustep 5 minutes UE/LE level 7      Lumbar Exercises: Standing   Other Standing Lumbar Exercises hip extension 2 x 10 each, hip abduction 2 x 10  each, hip flexion 2 x 10 each    Other Standing Lumbar Exercises SL hurdle stance 1 x 10 seconds      Lumbar Exercises: Seated   Other Seated Lumbar Exercises pelvic tilt 1 x 10      Lumbar Exercises: Supine   Dead Bug 10 reps    Dead Bug Limitations LE only      Lumbar Exercises: Sidelying   Clam 10 reps    Clam Limitations blue theraband                  PT Education - 09/27/20 1003    Education Details education on overall functional progress. updated HEP. D/C education.    Person(s) Educated Patient    Methods Explanation;Handout    Comprehension Verbalized understanding;Returned demonstration            PT Short Term Goals - 09/08/20 1128      PT SHORT TERM GOAL #1   Title She will be indpendent with initial hEP.    Time 3    Period Weeks    Status Achieved      PT SHORT TERM GOAL #2   Title She will report pain decreased 25% or more    Baseline pain at worse 6-7/10 with bending and stairs; initial 9/10    Time 3     Period Weeks    Status Achieved      PT SHORT TERM GOAL #3   Title FOTO improved to  50%    Time 3    Period Weeks    Status Achieved      PT SHORT TERM GOAL #4   Title She will be able to flex 60 degrees before incr pain    Time 3    Period Weeks    Status Achieved             PT Long Term Goals - 09/27/20 0941      PT LONG TERM GOAL #1   Title She will be independent with all hEp issued    Time 6    Period Weeks    Status Achieved      PT LONG TERM GOAL #2   Title She will report her pain as  intermittant and decreased 50% or more    Baseline pain with stair negotiation at home 1/10 able to reach into low cabinets with pain 0.2/10    Time 6    Period Weeks    Status Achieved      PT LONG TERM GOAL #3   Title FOTO improved to  60%    Baseline 69%    Time 6    Period Weeks    Status Achieved      PT LONG TERM GOAL #4   Title She will report able to done normal home tasks with min pain.    Baseline 1-2/10 with household tasks (cooking and and cleaning) sometimes    Time 6    Period Weeks    Status Achieved      PT LONG TERM GOAL #5   Title She will be able to sit fot 45-60 min before increased pain    Baseline patient reports ability to sit for 3-4 hours    Time 6    Period Weeks    Status Achieved      PT LONG TERM GOAL #6   Title Patient will be able to utilize bottom cupboards/cabinets at home with </= 2/10 low back pain.  Baseline able to utilize lunge to reach bottom cabinet with minimal pain.    Time 4    Period Weeks    Status Achieved                 Plan - 09/27/20 0946    Clinical Impression Statement Patient has progressed well throughout duration of care having met all established short term and long term functional goals. She has intermittent minimal Rt sided low back pain mostly noted with stair negotiation and reaching for items at a lower height at this time. Her lumbar ROM and LE/core strength have much improved since start  of care. She demonstrates independence with home program and is agreeable to discharge. She is therefore appropriate for discharge at this time to advanced HEP.    Personal Factors and Comorbidities Age;Time since onset of injury/illness/exacerbation;Past/Current Experience;Comorbidity 1    Comorbidities pacemaker    Examination-Activity Limitations Sit;Carry;Lift;Stairs    Examination-Participation Restrictions Cleaning;Meal Prep;Community Activity;Laundry    Stability/Clinical Decision Making Stable/Uncomplicated    PT Frequency --    PT Duration --    PT Treatment/Interventions Passive range of motion;Dry needling;Manual techniques;Patient/family education;Therapeutic exercise;Therapeutic activities;Moist Heat;Stair training;Functional mobility training;Neuromuscular re-education    PT Next Visit Plan --    PT Home Exercise Plan Access Code [email or web address]    Consulted and Agree with Plan of Care Patient           Patient will benefit from skilled therapeutic intervention in order to improve the following deficits and impairments:  Pain,Postural dysfunction,Decreased activity tolerance,Increased muscle spasms,Decreased range of motion,Decreased strength  Visit Diagnosis: Chronic bilateral low back pain, unspecified whether sciatica present  Muscle spasm of back  Abnormal posture  Joint stiffness of spine     Problem List Patient Active Problem List   Diagnosis Date Noted  . Stable angina ([place]) 08/14/2019  . Abnormal stress test 08/14/2019  . Encounter for care of pacemaker 08/08/2019  . Atypical chest pain 08/08/2019  . Pacemaker St Jude dual chamber MRI Assurity Dr-Rf - [id number] on 03/05/19 03/05/2019  . Third degree AV block ([place]) 03/04/2019  . Bradycardia 03/04/2019  . Dizziness 03/04/2019  . Syncope and collapse 03/04/2019  . Exertional dyspnea 03/04/2019  . Mixed hyperlipidemia 03/04/2019  . Essential hypertension 03/04/2019   PHYSICAL THERAPY DISCHARGE SUMMARY  Visits  from Start of Care: 18  Current functional level related to goals / functional outcomes: Patient has met all functional goals   Remaining deficits: See impression above   Education / Equipment: See above   Plan: Patient agrees to discharge.  Patient goals were met. Patient is being discharged due to meeting the stated rehab goals.  ?????         Gwendolyn Grant, PT, DPT, ATC 09/27/20 10:16 AM  [facility] [address] [place], [place], [address] Phone: [phone]   Fax:  [phone]  Name: Michelle Moran MRN: [id number] Date of Birth: 1955-10-16

## 2020-09-29 ENCOUNTER — Ambulatory Visit: Payer: Medicare Other

## 2020-10-01 ENCOUNTER — Ambulatory Visit: Payer: Medicare Other | Admitting: Cardiology

## 2020-10-01 ENCOUNTER — Other Ambulatory Visit: Payer: Self-pay

## 2020-10-01 ENCOUNTER — Encounter: Payer: Self-pay | Admitting: Cardiology

## 2020-10-01 VITALS — BP 146/90 | HR 78 | Temp 98.0°F | Ht 65.0 in | Wt 154.0 lb

## 2020-10-01 DIAGNOSIS — E782 Mixed hyperlipidemia: Secondary | ICD-10-CM

## 2020-10-01 DIAGNOSIS — R9439 Abnormal result of other cardiovascular function study: Secondary | ICD-10-CM

## 2020-10-01 DIAGNOSIS — I208 Other forms of angina pectoris: Secondary | ICD-10-CM

## 2020-10-01 DIAGNOSIS — I442 Atrioventricular block, complete: Secondary | ICD-10-CM

## 2020-10-01 DIAGNOSIS — I10 Essential (primary) hypertension: Secondary | ICD-10-CM

## 2020-10-01 MED ORDER — NITROGLYCERIN 0.4 MG SL SUBL: 0.4000 mg | SUBLINGUAL_TABLET | SUBLINGUAL | 3 refills | Status: DC | PRN

## 2020-10-01 MED ORDER — ATENOLOL 25 MG PO TABS: 25.0000 mg | ORAL_TABLET | Freq: Two times a day (BID) | ORAL | 0 refills | Status: DC

## 2020-10-01 NOTE — Progress Notes (Signed)
Follow up visit  Subjective:   Michelle Moran, female    DOB: [birth year], 66 y.o.   MRN: [id number]   HPI   Chief Complaint  Patient presents with  . Third degree AV block   . Follow-up     65 y.o. [place] female with hypertension, hyperlipidemia, prediabetes, GERD, now s/p dual chamber pacemaker for symptomatic third degree AV block.   Patient reports left-sided chest pain, worse with exertion better with rest.  However, this is also associated with jerking movements like sneezing and shoulder movements.  She has previously had a mildly abnormal stress test but has not undergone cardiac catheterization.  In addition, she also has had episodes of severe headache and nausea for the last few days.  She has known diagnosis of migraine, but is not on any treatment for migraine at this time.    Current Outpatient Medications on File Prior to Visit  Medication Sig Dispense Refill  . acetaminophen (TYLENOL) 325 MG tablet Take 1-2 tablets (325-650 mg total) by mouth every 6 (six) hours as needed for mild pain. 30 tablet 0  . aspirin EC 81 MG tablet Take 1 tablet (81 mg total) by mouth daily. 90 tablet 3  . atenolol (TENORMIN) 25 MG tablet Take 1 tablet (25 mg total) by mouth daily. May take additional 1/2 tab in the evening, in case of BP >140/80 mmHg. 120 tablet 3  . cephALEXin (KEFLEX) 500 MG capsule Take 500 mg by mouth 3 (three) times daily.    . cholecalciferol (VITAMIN D3) 25 MCG (1000 UT) tablet Take 1,000 Units by mouth daily.    . clindamycin (CLINDAGEL) 1 % gel     . nitroGLYCERIN (NITROSTAT) 0.4 MG SL tablet Place 1 tablet (0.4 mg total) under the tongue every 5 (five) minutes as needed for chest pain. 60 tablet 3  . Omega-3 Fatty Acids (FISH OIL) 1000 MG CPDR Take 1,000 mg by mouth daily.    . rosuvastatin (CRESTOR) 10 MG tablet Take 1 tablet (10 mg total) by mouth daily. 90 tablet 3   No current facility-administered medications on file prior to visit.     Cardiovascular & other pertient studies:  EKG 10/01/2020: Sinus rhythm with ventricular pacing  Scheduled Remote pacemaker check 08/30/2020:  There were 2 atrial high rate episodes detected. The longest lasted <1:30 min in duration, PAT. There was a <1 % cumulative atrial arrhythmia burden. No VHR episodes. Health trends do not demonstrate significant abnormality. Battery longevity is 10.4 years. RA pacing is 18.0 %, RV pacing is >99.0 %.  Scheduled Remote pacemaker check 03/07/2020: There were 02 atrial high rate episodes detected. The longest lasted <1 min in duration. Review of available EGMs demonstrates paroxysmal atrial tachycardia. There was a <1 % cumulative atrial arrhythmia burden. No VHR episodes. Health trends do not demonstrate significant abnormality. Battery longevity is 9.5 years. RA pacing is 16.0 %, RV pacing is 99.0 %. Overall no change from 12/03/2019.     EKG 12/31/2019: Sinus rhythm with ventricularly pacing  Scheduled Remote pacemaker check  12/01/2019: There were 10 atrial high rate episodes detected. The longest lasted 00:00:13:12 in duration. Review of available EGMs demonstrates paroxysmal atrial tachycardia. There was a <1 % cumulative atrial arrhythmia burden. No VHR episodes. Health trends do not demonstrate significant abnormality. Battery longevity is 9.8 years. RA pacing is 11.0 %, RV pacing is >99.0 %.  Lexiscan Tetrofosmin Stress Test  08/13/2019: Nondiagnostic ECG stress. Severe degree small extent perfusion defect consistent with  apical thinning. Mild ischemia in this region cannot be completely excluded.  This defect could be seen in paced rhythm.  All segments of left ventricle demonstrated normal wall motion and thickening. Stress LV EF is hyperdynamic 84%.  No previous exam available for comparison. Low risk study.  03/2019:  1. Successful implantation of a St. Jude dual-chamber pacemaker for symptomatic bradycardia due to complete heart  block  2. No early apparent complications.   Recent labs: 01/30/2020: Chol 171, TG 130, HDL 61, LDL 87  03/04/2019: Glucose 177, BUN/Cr 9/0.88. EGFR >60. Na/K 138/3.9. Rest of the CMP normal H/H 13/40. MCV 85. Platelets 265 HbA1C 7.1%  2010: Chol 204, TG 180, HDL 59, LDL 109 TSH 1.1 normal   Review of Systems  Cardiovascular: Positive for chest pain. Negative for dyspnea on exertion, leg swelling, palpitations and syncope.        Vitals:   10/01/20 1424 10/01/20 1428  BP: (!) 151/100 (!) 146/90  Pulse: 72 78  Temp: 98 F (36.7 C)   SpO2: 98% 100%     Objective:   Physical Exam Vitals and nursing note reviewed.  Constitutional:      Appearance: She is well-developed.  Neck:     Vascular: No JVD.  Cardiovascular:     Rate and Rhythm: Normal rate and regular rhythm.     Pulses: Intact distal pulses.     Heart sounds: Normal heart sounds. No murmur heard.   Pulmonary:     Effort: Pulmonary effort is normal.     Breath sounds: Normal breath sounds. No wheezing or rales.           Assessment & Recommendations:   65 y.o. [place] female with hypertension, hyperlipidemia, prediabetes, GERD, now s/p dual chamber pacemaker for symptomatic third degree AV block.   Third degree AV block: S/p pacemaker 03/2019. Normal functioning.  Chest pain: Features of both typical and atypical angina.  Previously abnormal stress test.  Definitive test would be invasive coronary angiogram.  Patient wants to hold off for now.  She has an upcoming trip to [place] in May.  If symptoms do not improve by then, she will then pursue coronary angiography.  At this time, I recommend increasing atenolol to 25 mg twice daily, also added sublingual nitroglycerin.    Hypertension: Blood pressure elevated today, usually better controlled.  I will increase atenolol, nonetheless.  Hyperlipidemia: Improved on Crestor.  Headache: Okay to use Tylenol for now.  I encouraged her to seek  further treatment through her PCP/neurology.  F/u in 4-6 weeks   Evamae Rowen Esther Hardy, MD [facility] [place] Pager: [phone] Office: [phone]

## 2020-10-06 ENCOUNTER — Ambulatory Visit: Payer: Medicare Other

## 2020-12-02 ENCOUNTER — Ambulatory Visit: Payer: Medicare Other | Admitting: Cardiology

## 2021-01-07 ENCOUNTER — Encounter: Payer: Self-pay | Admitting: Cardiology

## 2021-01-07 ENCOUNTER — Other Ambulatory Visit: Payer: Self-pay

## 2021-01-07 ENCOUNTER — Ambulatory Visit: Payer: Medicare Other | Admitting: Cardiology

## 2021-01-07 VITALS — BP 136/85 | HR 81 | Temp 97.9°F | Resp 16 | Ht 65.0 in | Wt 149.4 lb

## 2021-01-07 DIAGNOSIS — E782 Mixed hyperlipidemia: Secondary | ICD-10-CM

## 2021-01-07 DIAGNOSIS — I10 Essential (primary) hypertension: Secondary | ICD-10-CM

## 2021-01-07 DIAGNOSIS — I208 Other forms of angina pectoris: Secondary | ICD-10-CM

## 2021-01-07 DIAGNOSIS — I442 Atrioventricular block, complete: Secondary | ICD-10-CM

## 2021-01-07 MED ORDER — ATENOLOL 25 MG PO TABS: 25.0000 mg | ORAL_TABLET | ORAL | 0 refills | Status: DC

## 2021-01-07 NOTE — Progress Notes (Signed)
Follow up visit  Subjective:   Michelle Moran, female    DOB: 06-05-1956, 65 y.o.   MRN: [id number]   HPI   Chief Complaint  Patient presents with   Third degree AV block    Follow-up    2 months     65 y.o. [place] female with hypertension, hyperlipidemia, prediabetes, GERD, now s/p dual chamber pacemaker for symptomatic third degree AV block.   Patient is here today with her husband.  She recently returned from a vacation trip to [place].  After her return, she has noticed that her blood pressure has been running high.  It is usually high at OD in the morning and late in the evening, up to 146/99 mmHg.  She is also noticed left-sided chest pain, similar to what she has experienced in the past.  Episodes only last for few seconds, worse with exertion, improved with rest.  She has not had to take any nitroglycerin.    Current Outpatient Medications on File Prior to Visit  Medication Sig Dispense Refill   acetaminophen (TYLENOL) 325 MG tablet Take 1-2 tablets (325-650 mg total) by mouth every 6 (six) hours as needed for mild pain. 30 tablet 0   aspirin EC 81 MG tablet Take 1 tablet (81 mg total) by mouth daily. 90 tablet 3   atenolol (TENORMIN) 25 MG tablet Take 1 tablet (25 mg total) by mouth 2 (two) times daily. 1 tablet 0   cephALEXin (KEFLEX) 500 MG capsule Take 500 mg by mouth 3 (three) times daily.     cholecalciferol (VITAMIN D3) 25 MCG (1000 UT) tablet Take 1,000 Units by mouth daily.     clindamycin (CLINDAGEL) 1 % gel      nitroGLYCERIN (NITROSTAT) 0.4 MG SL tablet Place 1 tablet (0.4 mg total) under the tongue every 5 (five) minutes as needed for chest pain. 60 tablet 3   Omega-3 Fatty Acids (FISH OIL) 1000 MG CPDR Take 1,000 mg by mouth daily.     rosuvastatin (CRESTOR) 10 MG tablet Take 1 tablet (10 mg total) by mouth daily. 90 tablet 3   No current facility-administered medications on file prior to visit.    Cardiovascular & other pertient  studies:  EKG 01/07/2021: Sinus rhythm 63 bpm with ventricular pacing  Scheduled Remote pacemaker check 08/30/2020:  There were 2 atrial high rate episodes detected. The longest lasted <1:30 min in duration, PAT. There was a <1 % cumulative atrial arrhythmia burden. No VHR episodes. Health trends do not demonstrate significant abnormality. Battery longevity is 10.4 years. RA pacing is 18.0 %, RV pacing is >99.0 %.  Scheduled Remote pacemaker check 03/07/2020: There were 02 atrial high rate episodes detected. The longest lasted <1 min in duration. Review of available EGMs demonstrates paroxysmal atrial tachycardia. There was a <1 % cumulative atrial arrhythmia burden. No VHR episodes. Health trends do not demonstrate significant abnormality. Battery longevity is 9.5 years. RA pacing is 16.0 %, RV pacing is 99.0 %. Overall no change from 12/03/2019.   EKG 12/31/2019: Sinus rhythm with ventricularly pacing  Scheduled Remote pacemaker check  12/01/2019: There were 10 atrial high rate episodes detected. The longest lasted 00:00:13:12 in duration. Review of available EGMs demonstrates paroxysmal atrial tachycardia. There was a <1 % cumulative atrial arrhythmia burden. No VHR episodes. Health trends do not demonstrate significant abnormality. Battery longevity is 9.8 years. RA pacing is 11.0 %, RV pacing is >99.0 %.  Lexiscan Tetrofosmin Stress Test  08/13/2019: Nondiagnostic ECG stress. Severe  degree small extent perfusion defect consistent with apical thinning. Mild ischemia in this region cannot be completely excluded.  This defect could be seen in paced rhythm.  All segments of left ventricle demonstrated normal wall motion and thickening. Stress LV EF is hyperdynamic 84%.  No previous exam available for comparison. Low risk study.  03/2019:  1. Successful implantation of a St. Jude dual-chamber pacemaker for symptomatic bradycardia due to complete heart block  2. No early apparent  complications.   Recent labs: 01/30/2020: Chol 171, TG 130, HDL 61, LDL 87  03/04/2019: Glucose 177, BUN/Cr 9/0.88. EGFR >60. Na/K 138/3.9. Rest of the CMP normal H/H 13/40. MCV 85. Platelets 265 HbA1C 7.1%  2010: Chol 204, TG 180, HDL 59, LDL 109 TSH 1.1 normal   Review of Systems  Cardiovascular:  Positive for chest pain. Negative for dyspnea on exertion, leg swelling, palpitations and syncope.  Musculoskeletal:  Positive for joint pain.       Vitals:   01/07/21 1252  BP: 136/85  Pulse: 81  Resp: 16  Temp: 97.9 F (36.6 C)  SpO2: 98%     Objective:   Physical Exam Vitals and nursing note reviewed.  Constitutional:      General: She is not in acute distress. Neck:     Vascular: No JVD.  Cardiovascular:     Rate and Rhythm: Normal rate and regular rhythm.     Heart sounds: Normal heart sounds. No murmur heard. Pulmonary:     Effort: Pulmonary effort is normal.     Breath sounds: Normal breath sounds. No wheezing or rales.          Assessment & Recommendations:   65 y.o. [place] female with hypertension, hyperlipidemia, prediabetes, GERD, now s/p dual chamber pacemaker for symptomatic third degree AV block.   Third degree AV block: S/p pacemaker 03/2019. Normal functioning.  Chest pain: Features of both typical and atypical angina.   Symptoms are not lifestyle limiting, and she has not had to take nitroglycerin.   Continue current medical therapy, including Aspirin and statin. Increase atenolol, details below.    Hypertension: Uncontrolled. Increase atenolol 50 mg to 1-1/2 tablet in the morning and 1 tablet in the evening.  Mixed hyperlipidemia: Continue Crestor   F/u in 4-6 weeks   Florentino Laabs Esther Hardy, MD [facility]. [place] Pager: [phone] Office: [phone]

## 2021-02-07 ENCOUNTER — Other Ambulatory Visit: Payer: Self-pay

## 2021-02-07 ENCOUNTER — Telehealth: Payer: Self-pay | Admitting: Cardiology

## 2021-02-07 ENCOUNTER — Ambulatory Visit: Payer: Medicare Other | Admitting: Cardiology

## 2021-02-07 ENCOUNTER — Encounter: Payer: Self-pay | Admitting: Cardiology

## 2021-02-07 VITALS — BP 156/89 | HR 63 | Temp 97.7°F | Ht 65.0 in | Wt 148.0 lb

## 2021-02-07 DIAGNOSIS — I10 Essential (primary) hypertension: Secondary | ICD-10-CM

## 2021-02-07 DIAGNOSIS — R079 Chest pain, unspecified: Secondary | ICD-10-CM

## 2021-02-07 MED ORDER — AMLODIPINE BESYLATE 5 MG PO TABS: 5.0000 mg | ORAL_TABLET | Freq: Every day | ORAL | 3 refills | Status: DC

## 2021-02-07 MED ORDER — ATENOLOL 50 MG PO TABS: 50.0000 mg | ORAL_TABLET | Freq: Two times a day (BID) | ORAL | 2 refills | Status: DC

## 2021-02-07 NOTE — Telephone Encounter (Signed)
Pt's husband called concerned about Pt's BP. Would like to discuss w/medical assistant.

## 2021-02-07 NOTE — Telephone Encounter (Signed)
Pt called her bp has been 134 /94, 117/115,  hr 85 She complains of Headache and ear pain sob

## 2021-02-07 NOTE — Telephone Encounter (Signed)
BP looks okay. Please check if she would like to be seen today. Can see in the afternoon.   Thanks MJP

## 2021-02-07 NOTE — Progress Notes (Signed)
Follow up visit  Subjective:   Michelle Moran, female    DOB: Mar 26, 1956, 65 y.o.   MRN: [id number]   HPI   Chief Complaint  Patient presents with   Hypertension   Follow-up   Chest Pain     65 y.o. [place] female with hypertension, hyperlipidemia, prediabetes, GERD, now s/p dual chamber pacemaker for symptomatic third degree AV block.    Patient is here today with her husband. She is tearful due to symptoms of headache and fullness in her eyes since yesterday.  For the last couple days, she has noticed her blood pressure has been much higher than usual, systolic blood pressure of 270 mmHg.  Yesterday, she took atenolol 25 mg 3-1/2 tablets, instead of her usual dose of 2-1/2 tablets.  She has occasional chest pain lasting only for couple of seconds.  Last week, she noticed a blood clot in her mouth while brushing teeth.  She does have prior history of possible stomach ulcers, with EGD having had been performed in [place].     Current Outpatient Medications on File Prior to Visit  Medication Sig Dispense Refill   acetaminophen (TYLENOL) 325 MG tablet Take 1-2 tablets (325-650 mg total) by mouth every 6 (six) hours as needed for mild pain. 30 tablet 0   aspirin EC 81 MG tablet Take 1 tablet (81 mg total) by mouth daily. 90 tablet 3   atenolol (TENORMIN) 25 MG tablet Take 1 tablet (25 mg total) by mouth as directed. 1.5 in AM, 1 in PM 1 tablet 0   cholecalciferol (VITAMIN D3) 25 MCG (1000 UT) tablet Take 1,000 Units by mouth daily.     clindamycin (CLINDAGEL) 1 % gel      Omega-3 Fatty Acids (FISH OIL) 1000 MG CPDR Take 1,000 mg by mouth daily.     rosuvastatin (CRESTOR) 10 MG tablet Take 1 tablet (10 mg total) by mouth daily. 90 tablet 3   nitroGLYCERIN (NITROSTAT) 0.4 MG SL tablet Place 1 tablet (0.4 mg total) under the tongue every 5 (five) minutes as needed for chest pain. 60 tablet 3   No current facility-administered medications on file prior to visit.     Cardiovascular & other pertient studies:  EKG 02/07/2021: Sinus rhythm with ventricular pacing  Scheduled Remote pacemaker check 08/30/2020:  There were 2 atrial high rate episodes detected. The longest lasted <1:30 min in duration, PAT. There was a <1 % cumulative atrial arrhythmia burden. No VHR episodes. Health trends do not demonstrate significant abnormality. Battery longevity is 10.4 years. RA pacing is 18.0 %, RV pacing is >99.0 %.  Scheduled Remote pacemaker check 03/07/2020: There were 02 atrial high rate episodes detected. The longest lasted <1 min in duration. Review of available EGMs demonstrates paroxysmal atrial tachycardia. There was a <1 % cumulative atrial arrhythmia burden. No VHR episodes. Health trends do not demonstrate significant abnormality. Battery longevity is 9.5 years. RA pacing is 16.0 %, RV pacing is 99.0 %. Overall no change from 12/03/2019.   EKG 12/31/2019: Sinus rhythm with ventricularly pacing  Scheduled Remote pacemaker check  12/01/2019: There were 10 atrial high rate episodes detected. The longest lasted 00:00:13:12 in duration. Review of available EGMs demonstrates paroxysmal atrial tachycardia. There was a <1 % cumulative atrial arrhythmia burden. No VHR episodes. Health trends do not demonstrate significant abnormality. Battery longevity is 9.8 years. RA pacing is 11.0 %, RV pacing is >99.0 %.  Lexiscan Tetrofosmin Stress Test  08/13/2019: Nondiagnostic ECG stress. Severe degree small  extent perfusion defect consistent with apical thinning. Mild ischemia in this region cannot be completely excluded.  This defect could be seen in paced rhythm.  All segments of left ventricle demonstrated normal wall motion and thickening. Stress LV EF is hyperdynamic 84%.  No previous exam available for comparison. Low risk study.  03/2019:  1. Successful implantation of a St. Jude dual-chamber pacemaker for symptomatic bradycardia due to complete heart block  2.  No early apparent complications.   Recent labs: 01/30/2020: Chol 171, TG 130, HDL 61, LDL 87  03/04/2019: Glucose 177, BUN/Cr 9/0.88. EGFR >60. Na/K 138/3.9. Rest of the CMP normal H/H 13/40. MCV 85. Platelets 265 HbA1C 7.1%  2010: Chol 204, TG 180, HDL 59, LDL 109 TSH 1.1 normal   Review of Systems  Cardiovascular:  Positive for chest pain (Occasional). Negative for dyspnea on exertion, leg swelling, palpitations and syncope.  Musculoskeletal:  Positive for joint pain.  Neurological:  Positive for headaches.       Vitals:   02/07/21 1423  BP: (!) 156/89  Pulse: 63  Temp: 97.7 F (36.5 C)  SpO2: 99%     Objective:   Physical Exam Vitals and nursing note reviewed.  Constitutional:      General: She is not in acute distress. Neck:     Vascular: No JVD.  Cardiovascular:     Rate and Rhythm: Normal rate and regular rhythm.     Heart sounds: Normal heart sounds. No murmur heard. Pulmonary:     Effort: Pulmonary effort is normal.     Breath sounds: Normal breath sounds. No wheezing or rales.  Musculoskeletal:     Right lower leg: No edema.     Left lower leg: No edema.          Assessment & Recommendations:   65 y.o. [place] female with hypertension, hyperlipidemia, prediabetes, GERD, now s/p dual chamber pacemaker for symptomatic third degree AV block.   Hypertension: Uncontrolled.  Likely cause of her headache symptoms.  I do not see acute hypertensive emergency signs and symptoms.  Nonetheless, I have cautioned the patient to look for warning signs, such as blurry vision, vomiting, severe chest pain etc. At this time, I recommend increasing atenolol to 50 mg twice daily, with addition of amlodipine 5 mg daily. I will see her for follow-up in 1 week  Chest pain: No recent typical angina symptoms.  I suspect her chest pain today could be related to her hypertension.  Continue management of hypertension as above. Patient was previously on aspirin 81 mg  daily.  However, recently, she noticed a blood clot in her mouth while brushing teeth.  She does have prior history of stomach related issues, possibly ulcer.  I have asked her to stop aspirin and continue Nexium.  If symptoms do not improve, she may need further GI evaluation.  Third degree AV block: S/p pacemaker 03/2019. Normal functioning.  F/u in 1 week  Nigel Mormon, MD [facility] [place] Pager: [phone] Office: [phone]

## 2021-02-11 ENCOUNTER — Ambulatory Visit: Payer: Medicare Other | Admitting: Cardiology

## 2021-02-15 ENCOUNTER — Ambulatory Visit: Payer: Medicare Other | Admitting: Cardiology

## 2021-02-15 ENCOUNTER — Encounter: Payer: Self-pay | Admitting: Cardiology

## 2021-02-15 ENCOUNTER — Other Ambulatory Visit: Payer: Self-pay

## 2021-02-15 DIAGNOSIS — E782 Mixed hyperlipidemia: Secondary | ICD-10-CM

## 2021-02-15 DIAGNOSIS — I10 Essential (primary) hypertension: Secondary | ICD-10-CM

## 2021-02-15 MED ORDER — ROSUVASTATIN CALCIUM 10 MG PO TABS: 10.0000 mg | ORAL_TABLET | Freq: Every day | ORAL | 3 refills | Status: DC

## 2021-02-15 MED ORDER — ATENOLOL 50 MG PO TABS: 50.0000 mg | ORAL_TABLET | Freq: Two times a day (BID) | ORAL | 3 refills | Status: DC

## 2021-02-15 MED ORDER — AMLODIPINE BESYLATE 5 MG PO TABS: 5.0000 mg | ORAL_TABLET | Freq: Every day | ORAL | 3 refills | Status: DC

## 2021-02-15 NOTE — Progress Notes (Signed)
Follow up visit  Subjective:   Michelle Moran, female    DOB: Oct 24, 1955, 65 y.o.   MRN: [id number]   HPI   Chief Complaint  Patient presents with   Hypertension   Stable angina    Follow-up    4-6 weeks     65 y.o. [place] female with hypertension, hyperlipidemia, borderline type II diabetes, GERD, now s/p dual chamber pacemaker for symptomatic third degree AV block.   Patient is here with her daughter Pricilla Handler today.  Her hypertension and headaches have both improved since addition of amlodipine.  She is able to walk without any complaints of chest pain.  Her biggest complaint today is regarding her early satiety and difficulty swallowing, more solids than liquids.  In the discussion today, we also identified her A1c of 6.6%.  She is currently not on any medications for type 2 diabetes mellitus and would like to avoid them if possible.  Patient has an upcoming trip to [facility], asks if it would be okay for her to travel.  Patient tells me that she underwent EGD in 2010 while in [place] and was put on sucralfate.  She does not recollect if she was told to have stomach ulcers or not.  Current Outpatient Medications on File Prior to Visit  Medication Sig Dispense Refill   acetaminophen (TYLENOL) 325 MG tablet Take 1-2 tablets (325-650 mg total) by mouth every 6 (six) hours as needed for mild pain. 30 tablet 0   amLODipine (NORVASC) 5 MG tablet Take 1 tablet (5 mg total) by mouth daily. 30 tablet 3   atenolol (TENORMIN) 50 MG tablet Take 1 tablet (50 mg total) by mouth 2 (two) times daily. 1.5 in AM, 1 in PM 60 tablet 2   cholecalciferol (VITAMIN D3) 25 MCG (1000 UT) tablet Take 1,000 Units by mouth daily.     clindamycin (CLINDAGEL) 1 % gel      Omega-3 Fatty Acids (FISH OIL) 1000 MG CPDR Take 1,000 mg by mouth daily.     rosuvastatin (CRESTOR) 10 MG tablet Take 1 tablet (10 mg total) by mouth daily. 90 tablet 3   nitroGLYCERIN (NITROSTAT) 0.4 MG SL tablet Place 1  tablet (0.4 mg total) under the tongue every 5 (five) minutes as needed for chest pain. 60 tablet 3   No current facility-administered medications on file prior to visit.    Cardiovascular & other pertient studies:  EKG 02/07/2021: Sinus rhythm with ventricular pacing  Scheduled Remote pacemaker check 08/30/2020:  There were 2 atrial high rate episodes detected. The longest lasted <1:30 min in duration, PAT. There was a <1 % cumulative atrial arrhythmia burden. No VHR episodes. Health trends do not demonstrate significant abnormality. Battery longevity is 10.4 years. RA pacing is 18.0 %, RV pacing is >99.0 %.  Scheduled Remote pacemaker check 03/07/2020: There were 02 atrial high rate episodes detected. The longest lasted <1 min in duration. Review of available EGMs demonstrates paroxysmal atrial tachycardia. There was a <1 % cumulative atrial arrhythmia burden. No VHR episodes. Health trends do not demonstrate significant abnormality. Battery longevity is 9.5 years. RA pacing is 16.0 %, RV pacing is 99.0 %. Overall no change from 12/03/2019.  Scheduled Remote pacemaker check  12/01/2019: There were 10 atrial high rate episodes detected. The longest lasted 00:00:13:12 in duration. Review of available EGMs demonstrates paroxysmal atrial tachycardia. There was a <1 % cumulative atrial arrhythmia burden. No VHR episodes. Health trends do not demonstrate significant abnormality. Battery longevity  is 9.8 years. RA pacing is 11.0 %, RV pacing is >99.0 %.  Lexiscan Tetrofosmin Stress Test  08/13/2019: Nondiagnostic ECG stress. Severe degree small extent perfusion defect consistent with apical thinning. Mild ischemia in this region cannot be completely excluded.  This defect could be seen in paced rhythm.  All segments of left ventricle demonstrated normal wall motion and thickening. Stress LV EF is hyperdynamic 84%.  No previous exam available for comparison. Low risk study.  03/2019:  1.  Successful implantation of a St. Jude dual-chamber pacemaker for symptomatic bradycardia due to complete heart block  2. No early apparent complications.   Recent labs: 01/30/2020: Chol 171, TG 130, HDL 61, LDL 87  03/04/2019: Glucose 177, BUN/Cr 9/0.88. EGFR >60. Na/K 138/3.9. Rest of the CMP normal H/H 13/40. MCV 85. Platelets 265 HbA1C 7.1%  2010: Chol 204, TG 180, HDL 59, LDL 109 TSH 1.1 normal   Review of Systems  Cardiovascular:  Negative for chest pain, dyspnea on exertion, leg swelling, palpitations and syncope.  Musculoskeletal:  Positive for joint pain.  Gastrointestinal:  Positive for dysphagia. Negative for hematochezia and melena.       Early satiety  Neurological:  Positive for headaches.       Vitals:   02/15/21 0855  BP: 139/84  Pulse: 75  Resp: 16  Temp: (!) 97.3 F (36.3 C)  SpO2: 98%     Objective:   Physical Exam Vitals and nursing note reviewed.  Constitutional:      General: She is not in acute distress. Neck:     Vascular: No JVD.  Cardiovascular:     Rate and Rhythm: Normal rate and regular rhythm.     Heart sounds: Normal heart sounds. No murmur heard. Pulmonary:     Effort: Pulmonary effort is normal.     Breath sounds: Normal breath sounds. No wheezing or rales.  Musculoskeletal:     Right lower leg: No edema.     Left lower leg: No edema.          Assessment & Recommendations:   65 y.o. [place] female with hypertension, hyperlipidemia, prediabetes, GERD, now s/p dual chamber pacemaker for symptomatic third degree AV block.   Hypertension: Better controlled. Continue atenolol 50 mg bid, amlodipine 5 mg daily. With this, headache is improved. Doses can be increased further, as necessary, for further blood pressure control.  Early satiety, dysphagia: Could partially be due to gastroparesis. Remote h/o of ?ulcers. Ref to gastroenterologist Dr. Juanita Craver From cardiac standpoint, low risk for EGD.  Mixed  hyperlipidemia: HDL 70, LDL 100 on Crestor 10 mg  Third degree AV block: S/p pacemaker 03/2019. Normal functioning.  F/u in September 2022  Nigel Mormon, MD [facility] [place] Pager: [phone] Office: [phone]

## 2021-03-02 ENCOUNTER — Ambulatory Visit: Payer: Medicare Other | Admitting: Cardiology

## 2021-03-10 ENCOUNTER — Encounter: Payer: Self-pay | Admitting: Cardiology

## 2021-03-10 ENCOUNTER — Other Ambulatory Visit: Payer: Self-pay | Admitting: Cardiology

## 2021-03-10 ENCOUNTER — Other Ambulatory Visit: Payer: Self-pay

## 2021-03-10 ENCOUNTER — Ambulatory Visit: Payer: Medicare Other | Admitting: Cardiology

## 2021-03-10 VITALS — BP 130/78 | HR 60 | Temp 97.8°F | Resp 16 | Ht 65.0 in | Wt 146.0 lb

## 2021-03-10 DIAGNOSIS — E782 Mixed hyperlipidemia: Secondary | ICD-10-CM

## 2021-03-10 DIAGNOSIS — I10 Essential (primary) hypertension: Secondary | ICD-10-CM

## 2021-03-10 DIAGNOSIS — I442 Atrioventricular block, complete: Secondary | ICD-10-CM

## 2021-03-10 MED ORDER — ATENOLOL 50 MG PO TABS: 50.0000 mg | ORAL_TABLET | Freq: Two times a day (BID) | ORAL | 3 refills | Status: DC

## 2021-03-10 NOTE — Progress Notes (Signed)
Follow up visit  Subjective:   Michelle Moran, female    DOB: 04/22/1956, 65 y.o.   MRN: [id number]   HPI   Chief Complaint  Patient presents with   Hypertension   Follow-up    4-6 weeks     65 y.o. [place] female with hypertension, hyperlipidemia, borderline type II diabetes, GERD, now s/p dual chamber pacemaker for symptomatic third degree AV block.   Blood pressure very well controlled. She is tolerating amlodipine well barring occasional swelling on ankles. She is going to see Dr. Juanita Craver soon for early satiety symptoms.   Current Outpatient Medications on File Prior to Visit  Medication Sig Dispense Refill   acetaminophen (TYLENOL) 325 MG tablet Take 1-2 tablets (325-650 mg total) by mouth every 6 (six) hours as needed for mild pain. 30 tablet 0   amLODipine (NORVASC) 5 MG tablet Take 1 tablet (5 mg total) by mouth daily. 90 tablet 3   atenolol (TENORMIN) 50 MG tablet Take 1 tablet (50 mg total) by mouth 2 (two) times daily. 1.5 in AM, 1 in PM 180 tablet 3   cholecalciferol (VITAMIN D3) 25 MCG (1000 UT) tablet Take 1,000 Units by mouth daily.     clindamycin (CLINDAGEL) 1 % gel      nitroGLYCERIN (NITROSTAT) 0.4 MG SL tablet Place 1 tablet (0.4 mg total) under the tongue every 5 (five) minutes as needed for chest pain. 60 tablet 3   Omega-3 Fatty Acids (FISH OIL) 1000 MG CPDR Take 1,000 mg by mouth daily.     rosuvastatin (CRESTOR) 10 MG tablet Take 1 tablet (10 mg total) by mouth daily. 90 tablet 3   No current facility-administered medications on file prior to visit.    Cardiovascular & other pertient studies:  EKG 02/07/2021: Sinus rhythm with ventricular pacing  Scheduled Remote pacemaker check 08/30/2020:  There were 2 atrial high rate episodes detected. The longest lasted <1:30 min in duration, PAT. There was a <1 % cumulative atrial arrhythmia burden. No VHR episodes. Health trends do not demonstrate significant abnormality. Battery  longevity is 10.4 years. RA pacing is 18.0 %, RV pacing is >99.0 %.  Scheduled Remote pacemaker check 03/07/2020: There were 02 atrial high rate episodes detected. The longest lasted <1 min in duration. Review of available EGMs demonstrates paroxysmal atrial tachycardia. There was a <1 % cumulative atrial arrhythmia burden. No VHR episodes. Health trends do not demonstrate significant abnormality. Battery longevity is 9.5 years. RA pacing is 16.0 %, RV pacing is 99.0 %. Overall no change from 12/03/2019.  Scheduled Remote pacemaker check  12/01/2019: There were 10 atrial high rate episodes detected. The longest lasted 00:00:13:12 in duration. Review of available EGMs demonstrates paroxysmal atrial tachycardia. There was a <1 % cumulative atrial arrhythmia burden. No VHR episodes. Health trends do not demonstrate significant abnormality. Battery longevity is 9.8 years. RA pacing is 11.0 %, RV pacing is >99.0 %.  Lexiscan Tetrofosmin Stress Test  08/13/2019: Nondiagnostic ECG stress. Severe degree small extent perfusion defect consistent with apical thinning. Mild ischemia in this region cannot be completely excluded.  This defect could be seen in paced rhythm.  All segments of left ventricle demonstrated normal wall motion and thickening. Stress LV EF is hyperdynamic 84%.  No previous exam available for comparison. Low risk study.  03/2019:  1. Successful implantation of a St. Jude dual-chamber pacemaker for symptomatic bradycardia due to complete heart block  2. No early apparent complications.   Recent labs: 01/30/2020:  Chol 171, TG 130, HDL 61, LDL 87  03/04/2019: Glucose 177, BUN/Cr 9/0.88. EGFR >60. Na/K 138/3.9. Rest of the CMP normal H/H 13/40. MCV 85. Platelets 265 HbA1C 7.1%  2010: Chol 204, TG 180, HDL 59, LDL 109 TSH 1.1 normal   Review of Systems  Cardiovascular:  Negative for chest pain, dyspnea on exertion, leg swelling, palpitations and syncope.  Musculoskeletal:   Positive for joint pain.  Gastrointestinal:  Positive for dysphagia. Negative for hematochezia and melena.       Early satiety  Neurological:  Positive for headaches.       Vitals:   03/10/21 1116  BP: 130/78  Pulse: 60  Resp: 16  Temp: 97.8 F (36.6 C)  SpO2: 98%     Objective:   Physical Exam Vitals and nursing note reviewed.  Constitutional:      General: She is not in acute distress. Neck:     Vascular: No JVD.  Cardiovascular:     Rate and Rhythm: Normal rate and regular rhythm.     Heart sounds: Normal heart sounds. No murmur heard. Pulmonary:     Effort: Pulmonary effort is normal.     Breath sounds: Normal breath sounds. No wheezing or rales.  Musculoskeletal:     Right lower leg: No edema.     Left lower leg: No edema.          Assessment & Recommendations:   65 y.o. [place] female with hypertension, hyperlipidemia, prediabetes, GERD, now s/p dual chamber pacemaker for symptomatic third degree AV block.   Hypertension: Very well controlled. Continue atenolol 50 mg bid, amlodipine 5 mg daily.  Consider wearing compression stockings for occasional leg edema  Early satiety, dysphagia: Could partially be due to gastroparesis. Remote h/o of ?ulcers. Ref to gastroenterologist Dr. Juanita Craver From cardiac standpoint, low risk for EGD.  Mixed hyperlipidemia: HDL 70, LDL 100 on Crestor 10 mg  Third degree AV block: S/p pacemaker 03/2019. Normal functioning.  F/u in 05/2021 prior to her trip to [place]  Kolt Mcwhirter J Adjoa Althouse, MD [facility] [place] Pager: [phone] Office: [phone]

## 2021-05-02 ENCOUNTER — Encounter: Payer: Self-pay | Admitting: Pharmacist

## 2021-05-02 NOTE — Progress Notes (Signed)
CARE PLAN ENTRY  05/02/2021 Name: Michelle Moran MRN: [id number] DOB: [birth year]  Michelle Moran is enrolled in Remote Patient Monitoring/Principle Care Monitoring.  Date of Enrollment: 12/31/19 Supervising physician: Truett Mainland Indication: HTN  Remote Readings: Compliant and Avg BP: 137/87, HR:73  Next scheduled OV: 05/05/21  Pharmacist Clinical Goal(s):  Over the next 90 days, patient will demonstrate Improved medication adherence as evidenced by medication fill history Over the next 90 days, patient will demonstrate improved understanding of prescribed medications and rationale for usage as evidenced by patient teach back Over the next 90 days, patient will experience decrease in ED visits. ED visits in last 6 months = 0 Over the next 90 days, patient will not experience hospital admission. Hospital Admissions in last 6 months = 0  Interventions: Provider and Inter-disciplinary care team collaboration (see longitudinal plan of care) Comprehensive medication review performed. Discussed plans with patient for ongoing care management follow up and provided patient with direct contact information for care management team Collaboration with provider re: medication management  Patient Self Care Activities:  Self administers medications as prescribed Attends all scheduled provider appointments Performs ADL's independently Performs IADL's independently  Allergies  Allergen Reactions   Pollen Extract Other (See Comments)   Outpatient Encounter Medications as of 05/02/2021  Medication Sig   acetaminophen (TYLENOL) 325 MG tablet Take 1-2 tablets (325-650 mg total) by mouth every 6 (six) hours as needed for mild pain.   amLODipine (NORVASC) 5 MG tablet Take 1 tablet (5 mg total) by mouth daily.   atenolol (TENORMIN) 50 MG tablet Take 1 tablet (50 mg total) by mouth 2 (two) times daily.   cholecalciferol (VITAMIN D3) 25 MCG (1000 UT) tablet Take 1,000  Units by mouth daily.   clindamycin (CLINDAGEL) 1 % gel    nitroGLYCERIN (NITROSTAT) 0.4 MG SL tablet Place 1 tablet (0.4 mg total) under the tongue every 5 (five) minutes as needed for chest pain.   Omega-3 Fatty Acids (FISH OIL) 1000 MG CPDR Take 1,000 mg by mouth daily.   rosuvastatin (CRESTOR) 10 MG tablet Take 1 tablet (10 mg total) by mouth daily.   No facility-administered encounter medications on file as of 05/02/2021.    Hypertension   BP goal is:  <130/80  Office blood pressures are  BP Readings from Last 3 Encounters:  03/10/21 130/78  02/15/21 139/84  02/07/21 (!) 156/89    Patient is currently controlled on the following medications: Atenolol 50 mg BID, amlodipine 5 mg,   Patient checks BP at home daily  Patient home BP readings are ranging: 117-155/66-103   We discussed diet and exercise extensively  Plan  Continue current medications and control with diet and exercise    ______________ Visit Information SDOH (Social Determinants of Health) assessments performed: Yes.  Michelle Moran was given information about Principle Care Management/Remote Patient Monitoring services today including:  RPM/PCM service includes personalized support from designated clinical staff supervised by her physician, including individualized plan of care and coordination with other care providers 24/7 contact phone numbers for assistance for urgent and routine care needs. Standard insurance, coinsurance, copays and deductibles apply for principle care management only during months in which we provide at least 30 minutes of these services. Most insurances cover these services at 100%, however patients may be responsible for any copay, coinsurance and/or deductible if applicable. This service may help you avoid the need for more expensive face-to-face services. Only one practitioner may furnish and bill the service in a calendar  month. The patient may stop PCM/RPM services at any time  (effective at the end of the month) by phone call to the office staff.  Patient agreed to services and verbal consent obtained.   Cassell Clement, Pharm.D. Clinical Pharmacist [facility] [phone] Ext: 120

## 2021-05-05 ENCOUNTER — Ambulatory Visit: Payer: Medicare Other | Admitting: Cardiology

## 2021-06-25 ENCOUNTER — Encounter: Payer: Self-pay | Admitting: Cardiology

## 2021-07-23 ENCOUNTER — Encounter: Payer: Self-pay | Admitting: Cardiology

## 2021-08-20 ENCOUNTER — Other Ambulatory Visit: Payer: Self-pay | Admitting: Cardiology

## 2021-08-20 DIAGNOSIS — E782 Mixed hyperlipidemia: Secondary | ICD-10-CM

## 2021-08-20 DIAGNOSIS — I10 Essential (primary) hypertension: Secondary | ICD-10-CM

## 2021-08-22 ENCOUNTER — Other Ambulatory Visit: Payer: Self-pay | Admitting: Internal Medicine

## 2021-08-22 DIAGNOSIS — Z1231 Encounter for screening mammogram for malignant neoplasm of breast: Secondary | ICD-10-CM

## 2021-08-26 ENCOUNTER — Ambulatory Visit
Admission: RE | Admit: 2021-08-26 | Discharge: 2021-08-26 | Disposition: A | Discharge status: Home / Self Care | Payer: Medicare Other | Source: Ambulatory Visit | Attending: Internal Medicine | Admitting: Internal Medicine

## 2021-08-26 ENCOUNTER — Other Ambulatory Visit: Payer: Self-pay

## 2021-08-26 DIAGNOSIS — Z1231 Encounter for screening mammogram for malignant neoplasm of breast: Secondary | ICD-10-CM

## 2021-08-29 ENCOUNTER — Encounter: Payer: Self-pay | Admitting: Cardiology

## 2021-08-29 ENCOUNTER — Ambulatory Visit: Payer: Medicare Other | Admitting: Cardiology

## 2021-08-29 ENCOUNTER — Other Ambulatory Visit: Payer: Self-pay

## 2021-08-29 VITALS — BP 134/86 | HR 72 | Temp 97.8°F | Resp 17 | Ht 65.0 in | Wt 150.2 lb

## 2021-08-29 DIAGNOSIS — I10 Essential (primary) hypertension: Secondary | ICD-10-CM

## 2021-08-29 DIAGNOSIS — R9439 Abnormal result of other cardiovascular function study: Secondary | ICD-10-CM

## 2021-08-29 DIAGNOSIS — I208 Other forms of angina pectoris: Secondary | ICD-10-CM

## 2021-08-29 MED ORDER — NITROGLYCERIN 0.4 MG SL SUBL: 0.4000 mg | SUBLINGUAL_TABLET | SUBLINGUAL | 3 refills | Status: DC | PRN

## 2021-08-29 NOTE — Progress Notes (Signed)
Follow up visit  Subjective:   Michelle Moran, female    DOB: February 11, 1956, 66 y.o.   MRN: [id number]   HPI   Chief Complaint  Patient presents with   Follow-up   Hypertension     66 y.o. [place] female with hypertension, hyperlipidemia, borderline type II diabetes, GERD, now s/p dual chamber pacemaker for symptomatic third degree AV block.   Patient is doing well. She stopped amlodipine due to leg edema. Blood pressure is staying well controlled on atenolol 50 mg am and as needed 25 mg in pm. She has occasional pain at pacemaker site and left shoulder while doing certain activities like cutting vegetables. She does not have any exertional chest pain.   Current Outpatient Medications on File Prior to Visit  Medication Sig Dispense Refill   acetaminophen (TYLENOL) 325 MG tablet Take 1-2 tablets (325-650 mg total) by mouth every 6 (six) hours as needed for mild pain. 30 tablet 0   amLODipine (NORVASC) 5 MG tablet Take 1 tablet (5 mg total) by mouth daily. 90 tablet 3   atenolol (TENORMIN) 50 MG tablet Take 1 tablet (50 mg total) by mouth 2 (two) times daily. 180 tablet 3   cholecalciferol (VITAMIN D3) 25 MCG (1000 UT) tablet Take 1,000 Units by mouth daily.     clindamycin (CLINDAGEL) 1 % gel      nitroGLYCERIN (NITROSTAT) 0.4 MG SL tablet Place 1 tablet (0.4 mg total) under the tongue every 5 (five) minutes as needed for chest pain. 60 tablet 3   Omega-3 Fatty Acids (FISH OIL) 1000 MG CPDR Take 1,000 mg by mouth daily.     rosuvastatin (CRESTOR) 10 MG tablet Take 1 tablet by mouth once daily 90 tablet 0   No current facility-administered medications on file prior to visit.    Cardiovascular & other pertient studies:  EKG 2/27/2023L Sinus rhythm 71 bpm  Left bundle branch block  Remote dual-chamber pacemaker transmission 08/19/2021: AP 24%, VP 99%.  Longevity 7 years and 11 months.  Lead impedance and thresholds within normal limits. Lead noise noted.    Lexiscan Tetrofosmin Stress Test  08/13/2019: Nondiagnostic ECG stress. Severe degree small extent perfusion defect consistent with apical thinning. Mild ischemia in this region cannot be completely excluded.  This defect could be seen in paced rhythm.  All segments of left ventricle demonstrated normal wall motion and thickening. Stress LV EF is hyperdynamic 84%.  No previous exam available for comparison. Low risk study.  03/2019:  1. Successful implantation of a St. Jude dual-chamber pacemaker for symptomatic bradycardia due to complete heart block  2. No early apparent complications.   Recent labs: 01/30/2020: Chol 171, TG 130, HDL 61, LDL 87  03/04/2019: Glucose 177, BUN/Cr 9/0.88. EGFR >60. Na/K 138/3.9. Rest of the CMP normal H/H 13/40. MCV 85. Platelets 265 HbA1C 7.1%  2010: Chol 204, TG 180, HDL 59, LDL 109 TSH 1.1 normal   Review of Systems  Cardiovascular:  Negative for chest pain, dyspnea on exertion, leg swelling, palpitations and syncope.  Musculoskeletal:  Positive for joint pain.  Gastrointestinal:  Positive for dysphagia. Negative for hematochezia and melena.       Early satiety  Neurological:  Positive for headaches.       Vitals:   08/29/21 1425 08/29/21 1433  BP: (!) 153/80 134/86  Pulse: 84 72  Resp: 17   Temp: 97.8 F (36.6 C)   SpO2: 97% 97%     Objective:   Physical Exam  Vitals and nursing note reviewed.  Constitutional:      General: She is not in acute distress. Neck:     Vascular: No JVD.  Cardiovascular:     Rate and Rhythm: Normal rate and regular rhythm.     Heart sounds: Normal heart sounds. No murmur heard. Pulmonary:     Effort: Pulmonary effort is normal.     Breath sounds: Normal breath sounds. No wheezing or rales.  Musculoskeletal:     Right lower leg: No edema.     Left lower leg: No edema.          Assessment & Recommendations:   66 y.o. [place] female with hypertension, hyperlipidemia, prediabetes, GERD,  now s/p dual chamber pacemaker for symptomatic third degree AV block.   Hypertension: Well controlled. Continue atenolol 50 mg daily, with additional 25 mg in pm, as needed.   Mixed hyperlipidemia: HDL 70, LDL 100 on Crestor 10 mg  Third degree AV block: S/p pacemaker 03/2019. Normal functioning. Will arrange remote and in person monitoring  F/u in 6 moths  [place], MD [facility] Pager: [phone] Office: [phone]

## 2021-09-19 ENCOUNTER — Encounter: Payer: Self-pay | Admitting: Cardiology

## 2021-09-19 ENCOUNTER — Other Ambulatory Visit: Payer: Self-pay

## 2021-09-19 ENCOUNTER — Telehealth: Payer: Self-pay

## 2021-09-19 ENCOUNTER — Ambulatory Visit: Payer: Medicare Other | Admitting: Cardiology

## 2021-09-19 VITALS — BP 149/83 | HR 68 | Temp 97.8°F | Resp 16 | Ht 65.0 in | Wt 150.0 lb

## 2021-09-19 DIAGNOSIS — I10 Essential (primary) hypertension: Secondary | ICD-10-CM

## 2021-09-19 DIAGNOSIS — I442 Atrioventricular block, complete: Secondary | ICD-10-CM

## 2021-09-19 DIAGNOSIS — E782 Mixed hyperlipidemia: Secondary | ICD-10-CM

## 2021-09-19 MED ORDER — HYDRALAZINE HCL 50 MG PO TABS: 50.0000 mg | ORAL_TABLET | Freq: Three times a day (TID) | ORAL | 3 refills | Status: DC | PRN

## 2021-09-19 NOTE — Telephone Encounter (Signed)
Patient is scheduled for pacemaker check on 10/06/2021 @ 2:20pm ?

## 2021-09-19 NOTE — Progress Notes (Signed)
? ? ?Follow up visit ? ?Subjective:  ? ?Michelle Moran, female    DOB: 15-Jul-1955, 66 y.o.   MRN: [id number] ? ? ?HPI ? ? ?Chief Complaint  ?Patient presents with  ? Hypertension  ? Follow-up  ? ?  ?66 y/o [place] female with hypertension, hyperlipidemia, borderline type II diabetes, GERD, now s/p dual chamber pacemaker for symptomatic third degree AV block.  ? ?Since her recent visit with me, patient has started experiencing elevated blood pressure, associated with headaches. Reviewed home monitoring data. She is currently taking atenolol 50 mg bid, and amlodipine 5 mg in the afternoon. ? ? ?Current Outpatient Medications:  ?  acetaminophen (TYLENOL) 325 MG tablet, Take 1-2 tablets (325-650 mg total) by mouth every 6 (six) hours as needed for mild pain., Disp: 30 tablet, Rfl: 0 ?  amLODipine (NORVASC) 5 MG tablet, Take 1 tablet by mouth daily., Disp: , Rfl:  ?  ampicillin (PRINCIPEN) 500 MG capsule, Take 500 mg by mouth 2 (two) times daily., Disp: , Rfl:  ?  atenolol (TENORMIN) 50 MG tablet, Take 1 tablet (50 mg total) by mouth 2 (two) times daily., Disp: 180 tablet, Rfl: 3 ?  cholecalciferol (VITAMIN D3) 25 MCG (1000 UT) tablet, Take 1,000 Units by mouth daily., Disp: , Rfl:  ?  clindamycin (CLINDAGEL) 1 % gel, , Disp: , Rfl:  ?  nitroGLYCERIN (NITROSTAT) 0.4 MG SL tablet, Place 1 tablet (0.4 mg total) under the tongue every 5 (five) minutes as needed for chest pain., Disp: 60 tablet, Rfl: 3 ?  ofloxacin (OCUFLOX) 0.3 % ophthalmic solution, , Disp: , Rfl:  ?  Omega-3 Fatty Acids (FISH OIL) 1000 MG CPDR, Take 1,000 mg by mouth daily., Disp: , Rfl:  ?  rosuvastatin (CRESTOR) 10 MG tablet, Take 1 tablet by mouth once daily, Disp: 90 tablet, Rfl: 0 ? ?Cardiovascular & other pertient studies: ? ?EKG 2/27/2023L ?Sinus rhythm 71 bpm  ?Left bundle branch block ? ?Remote dual-chamber pacemaker transmission 08/19/2021: ?AP 24%, VP 99%.  Longevity 7 years and 11 months.  Lead impedance and thresholds within  normal limits. Lead noise noted.  ? ?Lexiscan Tetrofosmin Stress Test  08/13/2019: ?Nondiagnostic ECG stress. ?Severe degree small extent perfusion defect consistent with apical thinning. Mild ischemia in this region cannot be completely excluded.  This defect could be seen in paced rhythm.  ?All segments of left ventricle demonstrated normal wall motion and thickening. Stress LV EF is hyperdynamic 84%.  ?No previous exam available for comparison. Low risk study. ? ?03/2019: ? 1. Successful implantation of a St. Jude dual-chamber pacemaker for symptomatic bradycardia due to complete heart block ? 2. No early apparent complications.  ? ?Recent labs: ?08/31/2021: ?BUN/Cr 9/0.71. EGFR 93. Na/K 140/4.3. Rest of the CMP normal ?H/H 14/44.  ?HbA1C 6.7% ?Chol 188, TG 178, HDL 57, LDL 100 ? ? ?01/30/2020: ?Chol 171, TG 130, HDL 61, LDL 87 ? ?03/04/2019: ?Glucose 177, BUN/Cr 9/0.88. EGFR >60. Na/K 138/3.9. Rest of the CMP normal ?H/H 13/40. MCV 85. Platelets 265 ?HbA1C 7.1% ? ?2010: ?Chol 204, TG 180, HDL 59, LDL 109 ?TSH 1.1 normal ? ? ?Review of Systems  ?Cardiovascular:  Negative for chest pain, dyspnea on exertion, leg swelling, palpitations and syncope.  ?Musculoskeletal:  Positive for joint pain.  ?Gastrointestinal:  Positive for dysphagia. Negative for hematochezia and melena.  ?     Early satiety  ?Neurological:  Positive for headaches.  ? ?   ? ?Vitals:  ? 09/19/21 1429  ?BP: (!) 149/83  ?  Pulse: 68  ?Resp: 16  ?Temp: 97.8 ?F (36.6 ?C)  ?SpO2: 97%  ? ? ? ?Objective:  ? Physical Exam ?Vitals and nursing note reviewed.  ?Constitutional:   ?   General: She is not in acute distress. ?Neck:  ?   Vascular: No JVD.  ?Cardiovascular:  ?   Rate and Rhythm: Normal rate and regular rhythm.  ?   Heart sounds: Normal heart sounds. No murmur heard. ?Pulmonary:  ?   Effort: Pulmonary effort is normal.  ?   Breath sounds: Normal breath sounds. No wheezing or rales.  ?Musculoskeletal:  ?   Right lower leg: No edema.  ?   Left lower leg: No  edema.  ? ? ?  ICD-10-CM   ?1. Essential hypertension  I10 hydrALAZINE (APRESOLINE) 50 MG tablet  ?  ?2. Mixed hyperlipidemia  E78.2   ?  ?3. Third degree AV block (HCC)  I44.2   ?  ? ?Meds ordered this encounter  ?Medications  ? hydrALAZINE (APRESOLINE) 50 MG tablet  ?  Sig: Take 1 tablet (50 mg total) by mouth 3 (three) times daily as needed. If SBP >140 mmHg  ?  Dispense:  90 tablet  ?  Refill:  3  ? ? ? ? ?   ?Assessment & Recommendations:  ? ?66 y.o. [place] female with hypertension, hyperlipidemia, prediabetes, GERD, now s/p dual chamber pacemaker for symptomatic third degree AV block.  ? ?Hypertension: ?Uncontrolled. Stress could be contributing.  ?Continue atenolol 50 mg bid, amlodipine 5 mg daily. ?Added hydralazine 50 mg tid prn. ?Review data for two weeks. If hydralazine is required tid most days, will then consider switching to once a day telmisartan-HCTZ. ? ?Mixed hyperlipidemia: ?HDL 70, LDL 100 on Crestor 10 mg ? ?Third degree AV block: ?S/p pacemaker 03/2019. ?Normal functioning. ?Will arrange remote and in person monitoring ? ?F/u in 3-4 weeks ? ? ?Nigel Mormon, MD ?[facility] ?Pager: [phone] ?Office: [phone] ?   ?

## 2021-09-20 ENCOUNTER — Encounter: Payer: Self-pay | Admitting: Cardiology

## 2021-10-06 ENCOUNTER — Ambulatory Visit: Payer: Medicare Other | Admitting: Cardiology

## 2021-10-06 DIAGNOSIS — Z45018 Encounter for adjustment and management of other part of cardiac pacemaker: Secondary | ICD-10-CM

## 2021-10-06 DIAGNOSIS — I442 Atrioventricular block, complete: Secondary | ICD-10-CM

## 2021-10-06 DIAGNOSIS — Z950 Presence of cardiac pacemaker: Secondary | ICD-10-CM

## 2021-10-06 NOTE — Progress Notes (Signed)
Chief Complaint  ?Patient presents with  ? Pacemaker Check  ? ?Remote dual-chamber pacemaker transmission 08/19/2021: ?AP 24%, VP 99%.  Longevity 7 years and 11 months.  Lead impedance and thresholds within normal limits. Lead noise noted.  ? ? ?Encounter for care of pacemaker ? ?Pacemaker St Jude dual chamber MRI [employer]-Rf - [id number] on 03/05/19 ? ?Third degree AV block ([place]) ? ?Scheduled  In office pacemaker check 10/06/21  ?Single (S)/Dual (D)/BV: D. ?Presenting ASVP. ?Pacemaker dependant:  Yes. Underlying No escape at 35/min. AP 23%, VP >99%. Marland Kitchen ?AMS Episodes 0.   ?HVR 0. L  ?Longevity 7.6 Years. Magnet rate: >85%. ?Lead measurements: Stable. ?Histogram: Low (L)/normal (N)/high (H)  Normal. Patient activity Good.  ? ?Observations: Normal pacemaker function. Changes: None. ? ?

## 2021-10-13 ENCOUNTER — Encounter: Payer: Self-pay | Admitting: Cardiology

## 2021-10-13 ENCOUNTER — Ambulatory Visit: Payer: Medicare Other | Admitting: Cardiology

## 2021-10-13 VITALS — BP 118/72 | HR 76 | Temp 98.1°F | Resp 17 | Ht 65.0 in | Wt 147.6 lb

## 2021-10-13 DIAGNOSIS — I442 Atrioventricular block, complete: Secondary | ICD-10-CM

## 2021-10-13 DIAGNOSIS — I10 Essential (primary) hypertension: Secondary | ICD-10-CM

## 2021-10-13 MED ORDER — LISINOPRIL 20 MG PO TABS: 20.0000 mg | ORAL_TABLET | Freq: Every day | ORAL | 3 refills | Status: DC

## 2021-10-13 NOTE — Progress Notes (Signed)
? ? ?Follow up visit ? ?Subjective:  ? ?Michelle Moran, female    DOB: 1956/05/17, 66 y.o.   MRN: [id number] ? ? ?HPI ? ? ?Chief Complaint  ?Patient presents with  ? Hypertension  ?  3 weeks  ? ?  ?66 y/o [place] female with hypertension, hyperlipidemia, borderline type II diabetes, GERD, now s/p dual chamber pacemaker for symptomatic third degree AV block.  ? ?Blood pressure much better controlled now. She wants to change to once a day pill, instead of hydralazine.  ?On a separate note, she also notices myalgias in both her calves.  ? ? ?Current Outpatient Medications:  ?  acetaminophen (TYLENOL) 325 MG tablet, Take 1-2 tablets (325-650 mg total) by mouth every 6 (six) hours as needed for mild pain., Disp: 30 tablet, Rfl: 0 ?  amLODipine (NORVASC) 5 MG tablet, Take 1 tablet by mouth daily., Disp: , Rfl:  ?  ampicillin (PRINCIPEN) 500 MG capsule, Take 500 mg by mouth 2 (two) times daily., Disp: , Rfl:  ?  atenolol (TENORMIN) 50 MG tablet, Take 1 tablet (50 mg total) by mouth 2 (two) times daily., Disp: 180 tablet, Rfl: 3 ?  cholecalciferol (VITAMIN D3) 25 MCG (1000 UT) tablet, Take 1,000 Units by mouth daily., Disp: , Rfl:  ?  clindamycin (CLINDAGEL) 1 % gel, , Disp: , Rfl:  ?  hydrALAZINE (APRESOLINE) 50 MG tablet, Take 1 tablet (50 mg total) by mouth 3 (three) times daily as needed. If SBP >140 mmHg, Disp: 90 tablet, Rfl: 3 ?  nitroGLYCERIN (NITROSTAT) 0.4 MG SL tablet, Place 1 tablet (0.4 mg total) under the tongue every 5 (five) minutes as needed for chest pain., Disp: 60 tablet, Rfl: 3 ?  ofloxacin (OCUFLOX) 0.3 % ophthalmic solution, , Disp: , Rfl:  ?  Omega-3 Fatty Acids (FISH OIL) 1000 MG CPDR, Take 1,000 mg by mouth daily., Disp: , Rfl:  ?  rosuvastatin (CRESTOR) 10 MG tablet, Take 1 tablet by mouth once daily, Disp: 90 tablet, Rfl: 0 ? ?Cardiovascular & other pertient studies: ? ?EKG 08/29/2021: ?Sinus rhythm 71 bpm  ?Left bundle branch block ? ?Remote dual-chamber pacemaker transmission  08/19/2021: ?AP 24%, VP 99%.  Longevity 7 years and 11 months.  Lead impedance and thresholds within normal limits. Lead noise noted.  ? ?Lexiscan Tetrofosmin Stress Test  08/13/2019: ?Nondiagnostic ECG stress. ?Severe degree small extent perfusion defect consistent with apical thinning. Mild ischemia in this region cannot be completely excluded.  This defect could be seen in paced rhythm.  ?All segments of left ventricle demonstrated normal wall motion and thickening. Stress LV EF is hyperdynamic 84%.  ?No previous exam available for comparison. Low risk study. ? ?03/2019: ? 1. Successful implantation of a St. Jude dual-chamber pacemaker for symptomatic bradycardia due to complete heart block ? 2. No early apparent complications.  ? ?Recent labs: ?08/31/2021: ?BUN/Cr 9/0.71. EGFR 93. Na/K 140/4.3. Rest of the CMP normal ?H/H 14/44.  ?HbA1C 6.7% ?Chol 188, TG 178, HDL 57, LDL 100 ? ? ?01/30/2020: ?Chol 171, TG 130, HDL 61, LDL 87 ? ?03/04/2019: ?Glucose 177, BUN/Cr 9/0.88. EGFR >60. Na/K 138/3.9. Rest of the CMP normal ?H/H 13/40. MCV 85. Platelets 265 ?HbA1C 7.1% ? ?2010: ?Chol 204, TG 180, HDL 59, LDL 109 ?TSH 1.1 normal ? ? ?Review of Systems  ?Cardiovascular:  Negative for chest pain, dyspnea on exertion, leg swelling, palpitations and syncope.  ?Musculoskeletal:  Positive for joint pain.  ?Gastrointestinal:  Positive for dysphagia. Negative for hematochezia and melena.  ?  Early satiety  ?Neurological:  Positive for headaches.  ? ?   ? ?Vitals:  ? 10/13/21 1409  ?BP: 118/72  ?Pulse: 76  ?Resp: 17  ?Temp: 98.1 ?F (36.7 ?C)  ?SpO2: 98%  ? ? ? ?Objective:  ? Physical Exam ?Vitals and nursing note reviewed.  ?Constitutional:   ?   General: She is not in acute distress. ?Neck:  ?   Vascular: No JVD.  ?Cardiovascular:  ?   Rate and Rhythm: Normal rate and regular rhythm.  ?   Heart sounds: Normal heart sounds. No murmur heard. ?Pulmonary:  ?   Effort: Pulmonary effort is normal.  ?   Breath sounds: Normal breath sounds.  No wheezing or rales.  ?Musculoskeletal:  ?   Right lower leg: No edema.  ?   Left lower leg: No edema.  ? ? ?  ICD-10-CM   ?1. Essential hypertension  I10 lisinopril (ZESTRIL) 20 MG tablet  ?  Basic metabolic panel  ?  ?2. Third degree AV block (HCC)  I44.2   ?  ? ?Meds ordered this encounter  ?Medications  ? lisinopril (ZESTRIL) 20 MG tablet  ?  Sig: Take 1 tablet (20 mg total) by mouth daily.  ?  Dispense:  30 tablet  ?  Refill:  3  ? ? ? ? ?   ?Assessment & Recommendations:  ? ?66 y.o. [place] female with hypertension, hyperlipidemia, prediabetes, GERD, now s/p dual chamber pacemaker for symptomatic third degree AV block.  ? ?Hypertension: ?Now well controlled. ?Continue atenolol 50 mg bid, amlodipine 5 mg daily. ?Change hydralazine 50 mg tid to lisinopril 20 mg daily. ?Check BMP in 1 week ? ?Mixed hyperlipidemia: ?HDL 70, LDL 100 on Crestor 10 mg ?However, she has experienced myalgias Will hold Crestor for 2 weeks and see if symptoms improve. ? ?Third degree AV block: ?S/p pacemaker 03/2019. ?Normal functioning. ?Will arrange remote and in person monitoring ? ?F/u in 3-4 weeks ? ? ?Michelle Mormon, MD ?[facility] ?Pager: [phone] ?Office: [phone] ?   ?

## 2021-10-21 LAB — BASIC METABOLIC PANEL
BUN/Creatinine Ratio: 19 (ref 12–28)
BUN: 15 mg/dL (ref 8–27)
CO2: 21 mmol/L (ref 20–29)
Calcium: 9.8 mg/dL (ref 8.7–10.3)
Chloride: 101 mmol/L (ref 96–106)
Creatinine, Ser: 0.8 mg/dL (ref 0.57–1.00)
Glucose: 121 mg/dL — ABNORMAL HIGH (ref 70–99)
Potassium: 4.4 mmol/L (ref 3.5–5.2)
Sodium: 138 mmol/L (ref 134–144)
eGFR: 81 mL/min/{1.73_m2} (ref >59)

## 2021-11-03 ENCOUNTER — Ambulatory Visit: Payer: Medicare Other | Admitting: Cardiology

## 2021-11-14 ENCOUNTER — Encounter: Payer: Self-pay | Admitting: Cardiology

## 2021-11-14 ENCOUNTER — Ambulatory Visit: Payer: Medicare Other | Admitting: Cardiology

## 2021-11-14 VITALS — BP 137/80 | HR 73 | Temp 98.0°F | Resp 16 | Ht 65.0 in | Wt 144.0 lb

## 2021-11-14 DIAGNOSIS — G44221 Chronic tension-type headache, intractable: Secondary | ICD-10-CM

## 2021-11-14 DIAGNOSIS — I10 Essential (primary) hypertension: Secondary | ICD-10-CM

## 2021-11-14 NOTE — Progress Notes (Signed)
? ? ?Follow up visit ? ?Subjective:  ? ?Michelle Moran, female    DOB: [birth year], 66 y.o.   MRN: [id number] ? ? ?HPI ? ? ?Chief Complaint  ?Patient presents with  ? Hypertension  ? Follow-up  ?  3 week  ? ?  ?66 y/o [place] female with hypertension, hyperlipidemia, borderline type II diabetes, GERD, now s/p dual chamber pacemaker for symptomatic third degree AV block.  ? ?Blood pressure much better controlled now. However, she continues to have headaches. ? ? ? ?Current Outpatient Medications:  ?  acetaminophen (TYLENOL) 325 MG tablet, Take 1-2 tablets (325-650 mg total) by mouth every 6 (six) hours as needed for mild pain., Disp: 30 tablet, Rfl: 0 ?  amLODipine (NORVASC) 5 MG tablet, Take 1 tablet by mouth daily., Disp: , Rfl:  ?  atenolol (TENORMIN) 50 MG tablet, Take 1 tablet (50 mg total) by mouth 2 (two) times daily., Disp: 180 tablet, Rfl: 3 ?  butalbital-acetaminophen-caffeine (FIORICET) 50-325-40 MG tablet, Take 1 tablet by mouth 3 (three) times daily as needed., Disp: , Rfl:  ?  cholecalciferol (VITAMIN D3) 25 MCG (1000 UT) tablet, Take 1,000 Units by mouth daily., Disp: , Rfl:  ?  clindamycin (CLINDAGEL) 1 % gel, , Disp: , Rfl:  ?  doxycycline (VIBRAMYCIN) 100 MG capsule, Take 100 mg by mouth 2 (two) times daily., Disp: , Rfl:  ?  esomeprazole (NEXIUM) 40 MG capsule, Take 40 mg by mouth daily., Disp: , Rfl:  ?  lisinopril (ZESTRIL) 20 MG tablet, Take 1 tablet (20 mg total) by mouth daily., Disp: 30 tablet, Rfl: 3 ?  nitroGLYCERIN (NITROSTAT) 0.4 MG SL tablet, Place 1 tablet (0.4 mg total) under the tongue every 5 (five) minutes as needed for chest pain., Disp: 60 tablet, Rfl: 3 ?  ofloxacin (OCUFLOX) 0.3 % ophthalmic solution, , Disp: , Rfl:  ?  Omega-3 Fatty Acids (FISH OIL) 1000 MG CPDR, Take 1,000 mg by mouth daily., Disp: , Rfl:  ? ?Cardiovascular & other pertient studies: ? ?Scheduled  In office pacemaker check 10/06/21  ?Single (S)/Dual (D)/BV: D. ?Presenting ASVP. ?Pacemaker  dependant:  Yes. Underlying No escape at 35/min. AP 23%, VP >99%. Marland Kitchen ?AMS Episodes 0.   ?HVR 0. L  ?Longevity 7.6 Years. Magnet rate: >85%. ?Lead measurements: Stable. ?Histogram: Low (L)/normal (N)/high (H)  Normal. Patient activity Good.  ? ?Observations: Normal pacemaker function. Changes: None. ? ?EKG 08/29/2021: ?Sinus rhythm 71 bpm  ?Left bundle branch block ? ?Remote dual-chamber pacemaker transmission 08/19/2021: ?AP 24%, VP 99%.  Longevity 7 years and 11 months.  Lead impedance and thresholds within normal limits. Lead noise noted.  ? ?Lexiscan Tetrofosmin Stress Test  08/13/2019: ?Nondiagnostic ECG stress. ?Severe degree small extent perfusion defect consistent with apical thinning. Mild ischemia in this region cannot be completely excluded.  This defect could be seen in paced rhythm.  ?All segments of left ventricle demonstrated normal wall motion and thickening. Stress LV EF is hyperdynamic 84%.  ?No previous exam available for comparison. Low risk study. ? ?03/2019: ? 1. Successful implantation of a St. Jude dual-chamber pacemaker for symptomatic bradycardia due to complete heart block ? 2. No early apparent complications.  ? ?Recent labs: ?08/31/2021: ?BUN/Cr 9/0.71. EGFR 93. Na/K 140/4.3. Rest of the CMP normal ?H/H 14/44.  ?HbA1C 6.7% ?Chol 188, TG 178, HDL 57, LDL 100 ? ? ?01/30/2020: ?Chol 171, TG 130, HDL 61, LDL 87 ? ?03/04/2019: ?Glucose 177, BUN/Cr 9/0.88. EGFR >60. Na/K 138/3.9. Rest of the CMP  normal ?H/H 13/40. MCV 85. Platelets 265 ?HbA1C 7.1% ? ?2010: ?Chol 204, TG 180, HDL 59, LDL 109 ?TSH 1.1 normal ? ? ?Review of Systems  ?Cardiovascular:  Negative for chest pain, dyspnea on exertion, leg swelling, palpitations and syncope.  ?Musculoskeletal:  Positive for joint pain.  ?Gastrointestinal:  Positive for dysphagia. Negative for hematochezia and melena.  ?     Early satiety  ?Neurological:  Positive for headaches.  ? ?   ? ?Vitals:  ? 11/14/21 1433  ?BP: 137/80  ?Pulse: 73  ?Resp: 16  ?Temp: 98 ?F  (36.7 ?C)  ?SpO2: 97%  ? ? ? ?Objective:  ? Physical Exam ?Vitals and nursing note reviewed.  ?Constitutional:   ?   General: She is not in acute distress. ?Neck:  ?   Vascular: No JVD.  ?Cardiovascular:  ?   Rate and Rhythm: Normal rate and regular rhythm.  ?   Heart sounds: Normal heart sounds. No murmur heard. ?Pulmonary:  ?   Effort: Pulmonary effort is normal.  ?   Breath sounds: Normal breath sounds. No wheezing or rales.  ?Musculoskeletal:  ?   Right lower leg: No edema.  ?   Left lower leg: No edema.  ? ? ?  ICD-10-CM   ?1. Essential hypertension  I10   ?  ?2. Chronic tension-type headache, intractable  G44.221   ?  ? ? ? ?   ?Assessment & Recommendations:  ? ?66 y.o. [place] female with hypertension, hyperlipidemia, prediabetes, GERD, now s/p dual chamber pacemaker for symptomatic third degree AV block.  ? ?Hypertension: ?Well controlled on atenolol 50 mg bid, amlodipine 5 mg daily, lisinopril 20 mg daily. ? ?Headaches: ?Unrelated to hypertension, which is now controlled.  ?Consider Neurology referral. ? ?Mixed hyperlipidemia: ?Tolerating Crestor now. ? ?Third degree AV block: ?S/p pacemaker 03/2019. ?Normal functioning. ? ?F/u in 6 months ? ? ?Nigel Mormon, MD ?[facility] ?Pager: [phone] ?Office: [phone] ?   ?

## 2022-02-27 ENCOUNTER — Ambulatory Visit: Payer: Medicare Other | Admitting: Cardiology

## 2022-04-17 ENCOUNTER — Encounter: Payer: Self-pay | Admitting: Cardiology

## 2022-04-17 ENCOUNTER — Ambulatory Visit: Payer: Medicare Other | Admitting: Cardiology

## 2022-04-17 VITALS — BP 134/80 | HR 71 | Temp 98.0°F | Resp 16 | Ht 65.0 in | Wt 144.0 lb

## 2022-04-17 DIAGNOSIS — I2089 Other forms of angina pectoris: Secondary | ICD-10-CM

## 2022-04-17 DIAGNOSIS — I10 Essential (primary) hypertension: Secondary | ICD-10-CM

## 2022-04-17 MED ORDER — NITROGLYCERIN 0.4 MG SL SUBL: 0.4000 mg | SUBLINGUAL_TABLET | SUBLINGUAL | 3 refills | Status: DC | PRN

## 2022-04-17 NOTE — Progress Notes (Signed)
Follow up visit  Subjective:   Michelle Moran, female    DOB: 09/30/1955, 66 y.o.   MRN: [id number]   HPI   Chief Complaint  Patient presents with   Hypertension   Follow-up    32 month     66 y/o [place] female with hypertension, hyperlipidemia, borderline type II diabetes, GERD, now s/p dual chamber pacemaker for symptomatic third degree AV block.   Recently, patient has noticed recurrent episodes of left-sided chest pain radiating to left arm and back pain.  These episodes are worse with physical exertion, as well as certain difficulties using the left arm.  She is current on her physical activity because of the same.    Current Outpatient Medications:    acetaminophen (TYLENOL) 325 MG tablet, Take 1-2 tablets (325-650 mg total) by mouth every 6 (six) hours as needed for mild pain., Disp: 30 tablet, Rfl: 0   amLODipine (NORVASC) 5 MG tablet, Take 1 tablet by mouth daily., Disp: , Rfl:    atenolol (TENORMIN) 50 MG tablet, Take 1 tablet (50 mg total) by mouth 2 (two) times daily., Disp: 180 tablet, Rfl: 3   butalbital-acetaminophen-caffeine (FIORICET) 50-325-40 MG tablet, Take 1 tablet by mouth 3 (three) times daily as needed., Disp: , Rfl:    cholecalciferol (VITAMIN D3) 25 MCG (1000 UT) tablet, Take 1,000 Units by mouth daily., Disp: , Rfl:    Dapsone 5 % topical gel, SMARTSIG:1 sparingly Topical Twice Daily, Disp: , Rfl:    esomeprazole (NEXIUM) 40 MG capsule, Take 40 mg by mouth daily., Disp: , Rfl:    lisinopril (ZESTRIL) 20 MG tablet, Take 1 tablet (20 mg total) by mouth daily., Disp: 30 tablet, Rfl: 3   nitroGLYCERIN (NITROSTAT) 0.4 MG SL tablet, Place 1 tablet (0.4 mg total) under the tongue every 5 (five) minutes as needed for chest pain., Disp: 60 tablet, Rfl: 3   ofloxacin (OCUFLOX) 0.3 % ophthalmic solution, , Disp: , Rfl:    Omega-3 Fatty Acids (FISH OIL) 1000 MG CPDR, Take 1,000 mg by mouth daily., Disp: , Rfl:    rosuvastatin (CRESTOR) 10 MG  tablet, Take 10 mg by mouth daily., Disp: , Rfl:   Cardiovascular & other pertient studies:  Dual-chamber pacemaker transmission 02/17/2022: AP 25%, VP 99%.  Lead impedance and thresholds are normal.  There are brief atrial tachycardia episode.  Normal dual-chamber pacemaker function.  Scheduled  In office pacemaker check 10/06/21  Single (S)/Dual (D)/BV: D. Presenting ASVP. Pacemaker dependant:  Yes. Underlying No escape at 35/min. AP 23%, VP >99%. . AMS Episodes 0.   HVR 0. L  Longevity 7.6 Years. Magnet rate: >85%. Lead measurements: Stable. Histogram: Low (L)/normal (N)/high (H)  Normal. Patient activity Good.   Observations: Normal pacemaker function. Changes: None.  EKG 08/29/2021: Sinus rhythm 71 bpm  Left bundle branch block  Remote dual-chamber pacemaker transmission 08/19/2021: AP 24%, VP 99%.  Longevity 7 years and 11 months.  Lead impedance and thresholds within normal limits. Lead noise noted.   Lexiscan Tetrofosmin Stress Test  08/13/2019: Nondiagnostic ECG stress. Severe degree small extent perfusion defect consistent with apical thinning. Mild ischemia in this region cannot be completely excluded.  This defect could be seen in paced rhythm.  All segments of left ventricle demonstrated normal wall motion and thickening. Stress LV EF is hyperdynamic 84%.  No previous exam available for comparison. Low risk study.  03/2019:  1. Successful implantation of a St. Jude dual-chamber pacemaker for symptomatic bradycardia due to  complete heart block  2. No early apparent complications.   Recent labs: 08/31/2021: BUN/Cr 9/0.71. EGFR 93. Na/K 140/4.3. Rest of the CMP normal H/H 14/44.  HbA1C 6.7% Chol 188, TG 178, HDL 57, LDL 100   01/30/2020: Chol 171, TG 130, HDL 61, LDL 87  03/04/2019: Glucose 177, BUN/Cr 9/0.88. EGFR >60. Na/K 138/3.9. Rest of the CMP normal H/H 13/40. MCV 85. Platelets 265 HbA1C 7.1%  2010: Chol 204, TG 180, HDL 59, LDL 109 TSH 1.1  normal   Review of Systems  Cardiovascular:  Positive for chest pain. Negative for dyspnea on exertion, leg swelling, palpitations and syncope.  Musculoskeletal:  Positive for joint pain.  Gastrointestinal:  Positive for dysphagia. Negative for hematochezia and melena.       Early satiety  Neurological:  Positive for headaches.        Vitals:   04/17/22 1415  BP: 134/80  Pulse: 71  Resp: 16  Temp: 98 F (36.7 C)  SpO2: 98%     Objective:   Physical Exam Vitals and nursing note reviewed.  Constitutional:      General: She is not in acute distress. Neck:     Vascular: No JVD.  Cardiovascular:     Rate and Rhythm: Normal rate and regular rhythm.     Heart sounds: Normal heart sounds. No murmur heard. Pulmonary:     Effort: Pulmonary effort is normal.     Breath sounds: Normal breath sounds. No wheezing or rales.       ICD-10-CM   1. Essential hypertension  I10 EKG 12-Lead          Assessment & Recommendations:   66 y.o. [place] female with hypertension, hyperlipidemia, prediabetes, GERD, now s/p dual chamber pacemaker for symptomatic third degree AV block.   Chest pain: Previously abnormal stress test, symptomatic bradycardia with beta-blocker.  Now with worsening symptoms that has features of both angina and noncardiac chest pain, definitive coronary anatomy evaluation is necessary.  I discussed coronary CT angiogram versus heart catheterization.  Patient prefers CT angiogram.  I explained to her the possibility of artifact due to her pacemaker, but it is reasonable to attempt noninvasive evaluation first.  Continue atenolol, amlodipine.  Refilled sublingual nitroglycerin.  Hypertension: Well controlled on atenolol 50 mg bid, amlodipine 5 mg daily, lisinopril 20 mg daily.  Headaches: Unrelated to hypertension, which is now controlled.  Consider Neurology referral.  Mixed hyperlipidemia: Tolerating Crestor now.  Third degree AV block: S/p pacemaker  03/2019. Normal functioning.  F/u in 6 months   Miguelina Fore Esther Hardy, MD [facility] [place] Pager: [phone] Office: [phone]

## 2022-04-26 ENCOUNTER — Other Ambulatory Visit: Payer: Self-pay | Admitting: Cardiology

## 2022-04-26 DIAGNOSIS — I2089 Other forms of angina pectoris: Secondary | ICD-10-CM

## 2022-04-26 DIAGNOSIS — R9431 Abnormal electrocardiogram [ECG] [EKG]: Secondary | ICD-10-CM

## 2022-04-27 ENCOUNTER — Telehealth: Payer: Self-pay | Admitting: Cardiology

## 2022-04-28 NOTE — Telephone Encounter (Signed)
Noted ? ?Thanks ?MJP ? ?

## 2022-05-09 ENCOUNTER — Other Ambulatory Visit: Payer: Self-pay | Admitting: Cardiology

## 2022-05-09 DIAGNOSIS — I10 Essential (primary) hypertension: Secondary | ICD-10-CM

## 2022-05-13 LAB — BASIC METABOLIC PANEL
BUN/Creatinine Ratio: 18 (ref 12–28)
BUN: 15 mg/dL (ref 8–27)
CO2: 22 mmol/L (ref 20–29)
Calcium: 9.9 mg/dL (ref 8.7–10.3)
Chloride: 101 mmol/L (ref 96–106)
Creatinine, Ser: 0.82 mg/dL (ref 0.57–1.00)
Glucose: 112 mg/dL — ABNORMAL HIGH (ref 70–99)
Potassium: 4.3 mmol/L (ref 3.5–5.2)
Sodium: 141 mmol/L (ref 134–144)
eGFR: 78 mL/min/{1.73_m2} (ref >59)

## 2022-05-15 ENCOUNTER — Ambulatory Visit: Payer: Medicare Other | Admitting: Cardiology

## 2022-05-18 ENCOUNTER — Telehealth (HOSPITAL_COMMUNITY): Payer: Self-pay | Admitting: *Deleted

## 2022-05-18 NOTE — Telephone Encounter (Signed)
Reaching out to patient to offer assistance regarding upcoming cardiac imaging study; pt verbalizes understanding of appt date/time, parking situation and where to check in, and verified current allergies; name and call back number provided for further questions should they arise  Larey Brick RN Navigator Cardiac Imaging [facility] [phone] office [phone] cell  Patient to come at 2:30pm.

## 2022-05-19 ENCOUNTER — Ambulatory Visit (HOSPITAL_COMMUNITY)
Admission: RE | Admit: 2022-05-19 | Discharge: 2022-05-19 | Disposition: A | Discharge status: Home / Self Care | Payer: Medicare Other | Source: Ambulatory Visit | Attending: Cardiology | Admitting: Cardiology

## 2022-05-19 DIAGNOSIS — R9431 Abnormal electrocardiogram [ECG] [EKG]: Secondary | ICD-10-CM | POA: Diagnosis present

## 2022-05-19 DIAGNOSIS — I2089 Other forms of angina pectoris: Secondary | ICD-10-CM | POA: Diagnosis not present

## 2022-05-19 DIAGNOSIS — R079 Chest pain, unspecified: Secondary | ICD-10-CM | POA: Diagnosis not present

## 2022-05-19 DIAGNOSIS — I2510 Atherosclerotic heart disease of native coronary artery without angina pectoris: Secondary | ICD-10-CM | POA: Insufficient documentation

## 2022-05-19 MED ORDER — NITROGLYCERIN 0.4 MG SL SUBL: 0.8000 mg | SUBLINGUAL_TABLET | Freq: Once | SUBLINGUAL | Status: AC

## 2022-05-19 MED ORDER — NITROGLYCERIN 0.4 MG SL SUBL
SUBLINGUAL_TABLET | SUBLINGUAL | Status: AC
  Administered 2022-05-19: 0.8 mg via SUBLINGUAL
  Filled 2022-05-19: qty 2

## 2022-05-19 MED ORDER — METOPROLOL TARTRATE 5 MG/5ML IV SOLN: 10.0000 mg | INTRAVENOUS | Status: DC | PRN

## 2022-05-19 MED ORDER — METOPROLOL TARTRATE 5 MG/5ML IV SOLN
INTRAVENOUS | Status: AC
  Administered 2022-05-19: 5 mg via INTRAVENOUS
  Filled 2022-05-19: qty 10

## 2022-05-19 MED ORDER — IOHEXOL 350 MG/ML SOLN
100.0000 mL | Freq: Once | INTRAVENOUS | Status: AC | PRN
  Administered 2022-05-19: 100 mL via INTRAVENOUS

## 2022-05-19 MED ORDER — METOPROLOL TARTRATE 5 MG/5ML IV SOLN
5.0000 mg | INTRAVENOUS | Status: DC | PRN
Start: 2022-05-19 — End: 2022-05-20
  Administered 2022-05-19: 5 mg via INTRAVENOUS

## 2022-07-19 ENCOUNTER — Ambulatory Visit: Payer: 59 | Admitting: Cardiology

## 2022-07-19 ENCOUNTER — Encounter: Payer: Self-pay | Admitting: Cardiology

## 2022-07-19 ENCOUNTER — Telehealth: Payer: Self-pay

## 2022-07-19 VITALS — BP 131/69 | HR 72 | Resp 16 | Ht 65.0 in | Wt 143.0 lb

## 2022-07-19 DIAGNOSIS — I10 Essential (primary) hypertension: Secondary | ICD-10-CM

## 2022-07-19 DIAGNOSIS — I442 Atrioventricular block, complete: Secondary | ICD-10-CM

## 2022-07-19 MED ORDER — AMLODIPINE BESYLATE 5 MG PO TABS
5.0000 mg | ORAL_TABLET | Freq: Every day | ORAL | 3 refills | Status: DC
Start: 2022-07-19 — End: 2022-10-18

## 2022-07-19 MED ORDER — LISINOPRIL 20 MG PO TABS: 20.0000 mg | ORAL_TABLET | Freq: Every day | ORAL | 3 refills | Status: DC

## 2022-07-19 MED ORDER — ROSUVASTATIN CALCIUM 10 MG PO TABS: 10.0000 mg | ORAL_TABLET | Freq: Every day | ORAL | 3 refills | Status: DC

## 2022-07-19 MED ORDER — ATENOLOL 50 MG PO TABS: 50.0000 mg | ORAL_TABLET | Freq: Two times a day (BID) | ORAL | 3 refills | Status: DC

## 2022-07-19 NOTE — Progress Notes (Signed)
Follow up visit  Subjective:   Michelle Moran, female    DOB: [birth year], 67 y.o.   MRN: [id number]   HPI   Chief Complaint  Patient presents with   Hypertension   Follow-up    3 month     67 y/o [place] female with hypertension, hyperlipidemia, borderline type II diabetes, GERD, now s/p dual chamber pacemaker for symptomatic third degree AV block.   Patient home BP is overall fairly controlled on current antihypertensive regimen.    Average Systolic BP Level 517.61 mmHg Lowest Systolic BP Level 607 mmHg Highest Systolic BP Level 371 mmHg   07/18/2022 Tuesday at 07:46 PM      143 / 94                07/18/2022 Tuesday at 10:46 AM      120 / 84                07/18/2022 Tuesday at 10:45 AM      117 / 87                07/17/2022 Monday at 09:50 PM       142 / 85                07/17/2022 Monday at 04:44 PM       132 / 85                07/17/2022 Monday at 10:29 AM       133 / 96                07/16/2022 Sunday at 10:37 PM        142 / 84                07/16/2022 Sunday at 05:54 PM        136 / 79                07/16/2022 Sunday at 09:48 AM        127 / 78                07/15/2022 Saturday at 08:16 PM      13 5 / 87   Current Outpatient Medications:    acetaminophen (TYLENOL) 325 MG tablet, Take 1-2 tablets (325-650 mg total) by mouth every 6 (six) hours as needed for mild pain., Disp: 30 tablet, Rfl: 0   amLODipine (NORVASC) 5 MG tablet, Take 1 tablet by mouth daily., Disp: , Rfl:    atenolol (TENORMIN) 50 MG tablet, Take 1 tablet by mouth twice daily, Disp: 180 tablet, Rfl: 0   butalbital-acetaminophen-caffeine (FIORICET) 50-325-40 MG tablet, Take 1 tablet by mouth 3 (three) times daily as needed., Disp: , Rfl:    cholecalciferol (VITAMIN D3) 25 MCG (1000 UT) tablet, Take 1,000 Units by mouth daily., Disp: , Rfl:    Dapsone 5 % topical gel, SMARTSIG:1 sparingly Topical Twice Daily, Disp: , Rfl:    esomeprazole (NEXIUM) 40 MG capsule, Take 40 mg by  mouth daily., Disp: , Rfl:    lisinopril (ZESTRIL) 20 MG tablet, Take 1 tablet (20 mg total) by mouth daily., Disp: 30 tablet, Rfl: 3   nitroGLYCERIN (NITROSTAT) 0.4 MG SL tablet, Place 1 tablet (0.4 mg total) under the tongue every 5 (five) minutes as needed for chest pain., Disp: 30 tablet, Rfl: 3   ofloxacin (OCUFLOX) 0.3 % ophthalmic solution, , Disp: , Rfl:    Omega-3 Fatty  Acids (FISH OIL) 1000 MG CPDR, Take 1,000 mg by mouth daily., Disp: , Rfl:    rosuvastatin (CRESTOR) 10 MG tablet, Take 10 mg by mouth daily., Disp: , Rfl:   Cardiovascular & other pertient studies:  CCTA 05/19/2022: Calcium score 0. Normal coronary origin with right dominance. Normal coronary arteries. CAD RADS 0. Study limited for analysis of RCA coronary calcium due to beam artifact from pacing leads in RA and RV but no obvious plaque is present. Consider non atherosclerotic causes of chest pain.  Dual-chamber pacemaker transmission 05/19/2022: AP 32%, VP 99%.  Longevity 6 years and 10 months. Lead impedance and thresholds are normal.  There are brief atrial tachycardia episode.  Normal dual-chamber pacemaker function.  03/2019:  1. Successful implantation of a St. Jude dual-chamber pacemaker for symptomatic bradycardia due to complete heart block  2. No early apparent complications.   Recent labs: 08/31/2021: BUN/Cr 9/0.71. EGFR 93. Na/K 140/4.3. Rest of the CMP normal H/H 14/44.  HbA1C 6.7% Chol 188, TG 178, HDL 57, LDL 100   Review of Systems  Cardiovascular:  Positive for chest pain. Negative for dyspnea on exertion, leg swelling, palpitations and syncope.  Musculoskeletal:  Positive for joint pain.  Gastrointestinal:  Positive for dysphagia. Negative for hematochezia and melena.       Early satiety  Neurological:  Positive for headaches.        Vitals:   07/19/22 1445  BP: 131/69  Pulse: 72  Resp: 16  SpO2: 99%     Objective:   Physical Exam Vitals and nursing note reviewed.   Constitutional:      General: She is not in acute distress. Neck:     Vascular: No JVD.  Cardiovascular:     Rate and Rhythm: Normal rate and regular rhythm.     Heart sounds: Normal heart sounds. No murmur heard. Pulmonary:     Effort: Pulmonary effort is normal.     Breath sounds: Normal breath sounds. No wheezing or rales.       ICD-10-CM   1. Essential hypertension  I10 lisinopril (ZESTRIL) 20 MG tablet    atenolol (TENORMIN) 50 MG tablet    2. Third degree AV block (HCC)  I44.2           Assessment & Recommendations:   66 y.o. Asian [place] female with hypertension, hyperlipidemia, prediabetes, GERD, now s/p dual chamber pacemaker for symptomatic third degree AV block.   Chest pain: Normal CCTA. Consider noncardiac nonatherosclerotic cause for chest pain.  Hypertension: Well controlled on atenolol 50 mg bid, amlodipine 5 mg daily, lisinopril 20 mg daily.  Headaches: Resolved  Mixed hyperlipidemia: Tolerating Crestor now.  Third degree AV block: S/p pacemaker 03/2019. Normal functioning.  F/u in 3 months as per patient's request   Nigel Mormon, MD Pager: [phone] Office: [phone]

## 2022-07-19 NOTE — Telephone Encounter (Signed)
Patient home BP is overall fairly controlled on current antihypertensive regimen.   Average Systolic BP Level 891.69 mmHg Lowest Systolic BP Level 450 mmHg Highest Systolic BP Level 388 mmHg  07/18/2022 Tuesday at 07:46 PM 143 / 94      07/18/2022 Tuesday at 10:46 AM 120 / 84      07/18/2022 Tuesday at 10:45 AM 117 / 87      07/17/2022 Monday at 09:50 PM 142 / 85      07/17/2022 Monday at 04:44 PM 132 / 85      07/17/2022 Monday at 10:29 AM 133 / 96      07/16/2022 Sunday at 10:37 PM 142 / 84      07/16/2022 Sunday at 05:54 PM 136 / 79      07/16/2022 Sunday at 09:48 AM 127 / 78      01 /13/2024 Saturday at 08:16 PM 135 / 87

## 2022-08-11 ENCOUNTER — Other Ambulatory Visit: Payer: Self-pay | Admitting: Internal Medicine

## 2022-08-11 DIAGNOSIS — Z1231 Encounter for screening mammogram for malignant neoplasm of breast: Secondary | ICD-10-CM

## 2022-09-22 ENCOUNTER — Ambulatory Visit
Admission: RE | Admit: 2022-09-22 | Discharge: 2022-09-22 | Disposition: A | Discharge status: Home / Self Care | Payer: 59 | Source: Ambulatory Visit | Attending: Internal Medicine | Admitting: Internal Medicine

## 2022-09-22 DIAGNOSIS — Z1231 Encounter for screening mammogram for malignant neoplasm of breast: Secondary | ICD-10-CM

## 2022-10-18 ENCOUNTER — Telehealth: Payer: Self-pay

## 2022-10-18 ENCOUNTER — Ambulatory Visit: Payer: 59 | Admitting: Cardiology

## 2022-10-18 ENCOUNTER — Encounter: Payer: Self-pay | Admitting: Cardiology

## 2022-10-18 DIAGNOSIS — I10 Essential (primary) hypertension: Secondary | ICD-10-CM

## 2022-10-18 MED ORDER — ATENOLOL 50 MG PO TABS: 50.0000 mg | ORAL_TABLET | Freq: Two times a day (BID) | ORAL | 3 refills | Status: DC

## 2022-10-18 MED ORDER — ROSUVASTATIN CALCIUM 10 MG PO TABS: 10.0000 mg | ORAL_TABLET | Freq: Every day | ORAL | 3 refills | Status: DC

## 2022-10-18 NOTE — Telephone Encounter (Signed)
Patient's home blood pressure overall is fairly controlled on current antihypertensive regimen.  30-d summary Systolic (AM) mmHg -- 135.7 (120.0 - 153.0)  Diastolic (AM) mmHg -- 87.6 (68.0 - 102.0)  Heart Rate (AM) bpm -- 71.9 (65.0 - 91.0)   Systolic (PM) mmHg -- 141.1 (125.0 - 157.0)  Diastolic (PM) mmHg -- 87.3 (77.0 - 99.0) Heart Rate (PM) bpm -- 73.1 (63.0 - 87.0)  10/17/22 10:18 PM 143 / 78 75     10/17/22 5:23 PM 133 / 79 81     10/17/22 11:59 AM 139 / 83 69     10/16/22 9:39 PM 134 / 85 73     10/16/22 11:15 AM 126 / 79 66     10/15/22 10:13 PM 131 / 81 63     10/15/22 11:39 AM 120 / 81 78     10/14/22 9:41 PM 125 / 77 83     10/14/22 11:19 AM 141 / 96 69     10/13/22 11:17 PM 136 / 86 70     10/13/22 11:16 AM 133 / 89 72     10/12/22 11:01 PM 143 / 94 71     10/12/22 11:09 AM 143 / 87 73     10/11/22 10:04 PM 143 / 90 69     10/11/22 12:03 PM 138 / 92 73     10/10/22 10:57 PM 145 / 82 75

## 2022-10-18 NOTE — Progress Notes (Signed)
Follow up visit  Subjective:   Michelle Moran, female    DOB: [birth year], 67 y.o.   MRN: [id number]   HPI   Chief Complaint  Patient presents with   Hypertension   Follow-up    3 months     67 y/o [place] female with hypertension, hyperlipidemia, borderline type II diabetes, GERD, now s/p dual chamber pacemaker for symptomatic third degree AV block.   Patient's home blood pressure overall is fairly controlled on current antihypertensive regimen.   30-d summary Systolic (AM)   mmHg  --          135.7 (120.0 - 153.0)   Diastolic (AM)  mmHg  --          87.6 (68.0 - 102.0)       Heart Rate (AM)          bpm     --          71.9 (65.0 - 91.0)           Systolic (PM)   mmHg  --          141.1 (125.0 - 157.0)   Diastolic (PM)  mmHg  --          87.3 (77.0 - 99.0) Heart Rate (PM)          bpm     --          73.1 (63.0 - 87.0)   10/17/22 10:18 PM       143      /           78        75                                 10/17/22 5:23 PM         133      /           79        81                                 10/17/22 11:59 AM       139      /           83        69                                 10/16/22 9:39 PM         134      /           85        73                                 10/16/22 11:15 AM       126      /           79        66                                 10/15/22 10:13 PM  131      /           81        63                                 10/15/22 11:39 AM       120      /           81        78                                 10/14/22 9:41 PM         125      /           77        83                                 10/14/22 11:19 AM       141      /           96        69                                 10/13/22 11:17 PM       136      /           86        70                                 10/13/22 11:16 AM       133      /           89        72                                 10/12/22 11:01 PM       143      /           94        71                                  10/12/22 11:09 AM       143      /           87        73                                 10/11/22 10:04 PM       143      /           90        69                                 10/11/22 12:03 PM  138      /           92        73                                 10/10/22 10:57 PM         145      /           82        75          Patient denies any cardiac symptoms at this time.  She is primarily suffering from bloating and regurgitation symptoms ever since her cholecystectomy.  She has regular follow-up with gastroenterology.   Current Outpatient Medications:    acetaminophen (TYLENOL) 325 MG tablet, Take 1-2 tablets (325-650 mg total) by mouth every 6 (six) hours as needed for mild pain., Disp: 30 tablet, Rfl: 0   amLODipine (NORVASC) 5 MG tablet, Take 1 tablet (5 mg total) by mouth daily., Disp: 30 tablet, Rfl: 3   atenolol (TENORMIN) 50 MG tablet, Take 1 tablet (50 mg total) by mouth 2 (two) times daily., Disp: 180 tablet, Rfl: 3   butalbital-acetaminophen-caffeine (FIORICET) 50-325-40 MG tablet, Take 1 tablet by mouth 3 (three) times daily as needed., Disp: , Rfl:    cholecalciferol (VITAMIN D3) 25 MCG (1000 UT) tablet, Take 1,000 Units by mouth daily., Disp: , Rfl:    Dapsone 5 % topical gel, SMARTSIG:1 sparingly Topical Twice Daily, Disp: , Rfl:    esomeprazole (NEXIUM) 40 MG capsule, Take 40 mg by mouth daily., Disp: , Rfl:    lisinopril (ZESTRIL) 20 MG tablet, Take 1 tablet (20 mg total) by mouth daily., Disp: 90 tablet, Rfl: 3   montelukast (SINGULAIR) 10 MG tablet, Take 10 mg by mouth daily as needed., Disp: , Rfl:    nitroGLYCERIN (NITROSTAT) 0.4 MG SL tablet, Place 1 tablet (0.4 mg total) under the tongue every 5 (five) minutes as needed for chest pain., Disp: 30 tablet, Rfl: 3   ofloxacin (OCUFLOX) 0.3 % ophthalmic solution, , Disp: , Rfl:    Omega-3 Fatty Acids (FISH OIL) 1000 MG CPDR, Take 1,000 mg by mouth daily., Disp: , Rfl:    rosuvastatin (CRESTOR) 10 MG  tablet, Take 1 tablet (10 mg total) by mouth daily., Disp: 90 tablet, Rfl: 3  Cardiovascular & other pertient studies:  Dual-chamber pacemaker transmission 08/18/2022: Longevity 6 years and 8 months AP 28%, VP 99%.  Lead impedance and thresholds are normal.  There are brief atrial tachycardia episode.  Normal dual-chamber pacemaker function.  CCTA 05/19/2022: Calcium score 0. Normal coronary origin with right dominance. Normal coronary arteries. CAD RADS 0. Study limited for analysis of RCA coronary calcium due to beam artifact from pacing leads in RA and RV but no obvious plaque is present. Consider non atherosclerotic causes of chest pain.  03/2019: 1. Successful implantation of a St. Jude dual-chamber pacemaker for symptomatic bradycardia due to complete heart block 2. No early apparent complications.   Recent labs: 08/31/2021: BUN/Cr 9/0.71. EGFR 93. Na/K 140/4.3. Rest of the CMP normal H/H 14/44.  HbA1C 6.7% Chol 188, TG 178, HDL 57, LDL 100   Review of Systems  Cardiovascular:  Positive for chest pain. Negative for dyspnea on exertion, leg swelling, palpitations and syncope.  Musculoskeletal:  Positive for joint pain.  Gastrointestinal:  Positive for dysphagia. Negative for hematochezia and melena.  Early satiety  Neurological:  Positive for headaches.        Vitals:   10/18/22 1332 10/18/22 1334  BP: (!) 152/75 (!) 148/87  Pulse: 65   SpO2: 98%      Objective:   Physical Exam Vitals and nursing note reviewed.  Constitutional:      General: She is not in acute distress. Neck:     Vascular: No JVD.  Cardiovascular:     Rate and Rhythm: Normal rate and regular rhythm.     Heart sounds: Normal heart sounds. No murmur heard. Pulmonary:     Effort: Pulmonary effort is normal.     Breath sounds: Normal breath sounds. No wheezing or rales.       ICD-10-CM   1. Essential hypertension  I10 atenolol (TENORMIN) 50 MG tablet          Assessment &  Recommendations:   67 y.o. [place] female with hypertension, hyperlipidemia, prediabetes, GERD, now s/p dual chamber pacemaker for symptomatic third degree AV block.   Chest pain: Normal CCTA. Now resolved.  Hypertension: Well controlled on atenolol 50 mg bid. She is not taking amlodipine 5 mg daily, lisinopril 20 mg daily. Blood pressure is controlled even without it.  Headaches: Resolved  Mixed hyperlipidemia: Tolerating Crestor now.  Third degree AV block: S/p pacemaker 03/2019. Normal functioning.  F/u in 6 months as per patient's request   Elder Negus, MD Pager: [phone] Office: [phone]

## 2022-10-26 ENCOUNTER — Encounter: Payer: Self-pay | Admitting: Internal Medicine

## 2022-10-26 ENCOUNTER — Other Ambulatory Visit: Payer: Self-pay | Admitting: Internal Medicine

## 2022-10-26 DIAGNOSIS — N2889 Other specified disorders of kidney and ureter: Secondary | ICD-10-CM

## 2022-11-06 ENCOUNTER — Ambulatory Visit
Admission: RE | Admit: 2022-11-06 | Discharge: 2022-11-06 | Disposition: A | Discharge status: Home / Self Care | Payer: 59 | Source: Ambulatory Visit | Attending: Internal Medicine | Admitting: Internal Medicine

## 2022-11-06 DIAGNOSIS — N2889 Other specified disorders of kidney and ureter: Secondary | ICD-10-CM

## 2022-11-06 MED ORDER — IOPAMIDOL (ISOVUE-300) INJECTION 61%
100.0000 mL | Freq: Once | INTRAVENOUS | Status: AC | PRN
  Administered 2022-11-06: 100 mL via INTRAVENOUS

## 2022-11-28 ENCOUNTER — Other Ambulatory Visit: Payer: 59

## 2023-01-07 ENCOUNTER — Other Ambulatory Visit: Payer: Self-pay | Admitting: Cardiology

## 2023-01-07 DIAGNOSIS — I2089 Other forms of angina pectoris: Secondary | ICD-10-CM

## 2023-04-18 ENCOUNTER — Ambulatory Visit: Payer: Self-pay | Admitting: Cardiology

## 2023-04-26 ENCOUNTER — Encounter: Payer: Self-pay | Admitting: Cardiology

## 2023-04-26 ENCOUNTER — Ambulatory Visit: Payer: 59 | Attending: Cardiology | Admitting: Cardiology

## 2023-04-26 VITALS — BP 142/72 | HR 68 | Resp 16 | Ht 65.0 in | Wt 144.0 lb

## 2023-04-26 DIAGNOSIS — I442 Atrioventricular block, complete: Secondary | ICD-10-CM | POA: Diagnosis not present

## 2023-04-26 DIAGNOSIS — I10 Essential (primary) hypertension: Secondary | ICD-10-CM | POA: Diagnosis not present

## 2023-04-26 DIAGNOSIS — E782 Mixed hyperlipidemia: Secondary | ICD-10-CM | POA: Diagnosis not present

## 2023-04-26 LAB — LIPID PANEL
Chol/HDL Ratio: 2.7 ratio (ref 0.0–4.4)
Cholesterol, Total: 175 mg/dL (ref 100–199)
HDL: 65 mg/dL (ref >39)
LDL Chol Calc (NIH): 89 mg/dL (ref 0–99)
Triglycerides: 118 mg/dL (ref 0–149)
VLDL Cholesterol Cal: 21 mg/dL (ref 5–40)

## 2023-04-26 MED ORDER — ATENOLOL 50 MG PO TABS: 50.0000 mg | ORAL_TABLET | Freq: Two times a day (BID) | ORAL | 3 refills | Status: DC

## 2023-04-26 MED ORDER — NITROGLYCERIN 0.4 MG SL SUBL: 0.4000 mg | SUBLINGUAL_TABLET | SUBLINGUAL | 3 refills | Status: DC | PRN

## 2023-04-26 NOTE — Patient Instructions (Signed)
Medication Instructions:   Your physician recommends that you continue on your current medications as directed. Please refer to the Current Medication list given to you today.  *If you need a refill on your cardiac medications before your next appointment, please call your pharmacy*   Lab Work:  TODAY--LIPIDS  If you have labs (blood work) drawn today and your tests are completely normal, you will receive your results only by: MyChart Message (if you have MyChart) OR A paper copy in the mail If you have any lab test that is abnormal or we need to change your treatment, we will call you to review the results.    Follow-Up:  1.)  OUR DEVICE TEAM WILL CALL YOU SOON TO SCHEDULE YOUR REMOTE PACER CHECK TO AN IN-PERSON VISIT WITH THE PROVIDER IN JANUARY 2025  2.)  6 MONTHS WITH DR. PATWARDHAN

## 2023-04-26 NOTE — Progress Notes (Signed)
Cardiology Office Note:  .   Date:  04/26/2023  ID:  Michelle Moran, DOB [birth year], MRN [id number] PCP: Galvin Proffer, MD  [facility] Providers Cardiologist:  Truett Mainland, MD PCP: Galvin Proffer, MD  Chief Complaint  Patient presents with   Essential hypertension   Follow-up    6 months      History of Present Illness: .    Michelle Moran is a 67 y.o. female with hypertension, hyperlipidemia, prediabetes, GERD, now s/p dual chamber pacemaker for symptomatic third degree AV block.   Patient is doing well, does not have any complaints today.  Blood pressure slightly elevated today, but she has not taken her medications.  Generally, blood pressure is very well-controlled.  She recently went on a trip to [place] where she walked to 565 steps without any difficulty.  She is planning to visit [place] in November, where she will spend 2 months.  Vitals:   04/26/23 0822  BP: (!) 142/72  Pulse: 68  Resp: 16  SpO2: 98%     ROS:  Review of Systems  Cardiovascular:  Negative for chest pain, dyspnea on exertion, leg swelling, palpitations and syncope.     Studies Reviewed: Marland Kitchen    EKG 04/26/2023: AV dual-paced rhythm No previous ECGs available  CTA cor 05/19/2022: 1. Coronary calcium score of 0. This was 0 percentile for age-, sex, and race-matched controls. Sensitivity for detecting calcium in the RCA is limited by beam artifact from pacing wires. 2. Normal coronary origin with right dominance. 3. Normal coronary arteries. CAD RADS 0. Study limited for analysis of RCA coronary calcium due to beam artifact from pacing leads in RA and RV but no obvious plaque is present. 4.  Consider non atherosclerotic causes of chest pain.    Physical Exam:   Physical Exam Vitals and nursing note reviewed.  Constitutional:      General: She is not in acute distress. Neck:     Vascular: No JVD.  Cardiovascular:     Rate and Rhythm: Normal rate  and regular rhythm.     Heart sounds: Normal heart sounds. No murmur heard. Pulmonary:     Effort: Pulmonary effort is normal.     Breath sounds: Normal breath sounds. No wheezing or rales.  Musculoskeletal:     Right lower leg: No edema.     Left lower leg: No edema.      VISIT DIAGNOSES:   ICD-10-CM   1. Essential hypertension  I10 EKG 12-Lead    atenolol (TENORMIN) 50 MG tablet    nitroGLYCERIN (NITROSTAT) 0.4 MG SL tablet    2. Mixed hyperlipidemia  E78.2 atenolol (TENORMIN) 50 MG tablet    nitroGLYCERIN (NITROSTAT) 0.4 MG SL tablet    Lipid Profile       ASSESSMENT AND PLAN: .    Michelle Moran is a 67 y.o. female with hypertension, hyperlipidemia, prediabetes, GERD, now s/p dual chamber pacemaker for symptomatic third degree AV block.   Hypertension: Reasonably well-controlled.  Refilled atenolol 50 mg daily which has worked well for her.  Complete AV block: Well-functioning chamber pacemaker.  Will arrange for remote and in person checks.  Mixed hyperlipidemia: Continue rosuvastatin.  Will check blood panel today.  Meds ordered this encounter  Medications   atenolol (TENORMIN) 50 MG tablet    Sig: Take 1 tablet (50 mg total) by mouth 2 (two) times daily.    Dispense:  180 tablet    Refill:  3  nitroGLYCERIN (NITROSTAT) 0.4 MG SL tablet    Sig: Place 1 tablet (0.4 mg total) under the tongue every 5 (five) minutes as needed for chest pain.    Dispense:  25 tablet    Refill:  3     F/u in 6 months  Signed, Elder Negus, MD

## 2023-05-29 ENCOUNTER — Ambulatory Visit: Payer: 59 | Admitting: Cardiology

## 2023-08-07 ENCOUNTER — Other Ambulatory Visit: Payer: Self-pay | Admitting: Internal Medicine

## 2023-08-07 ENCOUNTER — Encounter: Payer: Self-pay | Admitting: Internal Medicine

## 2023-08-07 ENCOUNTER — Ambulatory Visit: Payer: 59 | Attending: Internal Medicine | Admitting: Internal Medicine

## 2023-08-07 VITALS — BP 116/76 | HR 67 | Ht 65.0 in | Wt 148.0 lb

## 2023-08-07 DIAGNOSIS — Z950 Presence of cardiac pacemaker: Secondary | ICD-10-CM | POA: Diagnosis not present

## 2023-08-07 DIAGNOSIS — I2089 Other forms of angina pectoris: Secondary | ICD-10-CM | POA: Diagnosis not present

## 2023-08-07 DIAGNOSIS — Z1231 Encounter for screening mammogram for malignant neoplasm of breast: Secondary | ICD-10-CM

## 2023-08-07 DIAGNOSIS — I442 Atrioventricular block, complete: Secondary | ICD-10-CM | POA: Diagnosis not present

## 2023-08-07 DIAGNOSIS — I10 Essential (primary) hypertension: Secondary | ICD-10-CM

## 2023-08-07 LAB — CUP PACEART INCLINIC DEVICE CHECK
Battery Remaining Longevity: 68 mo
Battery Voltage: 3.01 V
Brady Statistic RA Percent Paced: 26 %
Brady Statistic RV Percent Paced: 99.99 %
Date Time Interrogation Session: 20250204130413
Implantable Lead Connection Status: 753985
Implantable Lead Connection Status: 753985
Implantable Lead Implant Date: 20200902
Implantable Lead Implant Date: 20200902
Implantable Lead Location: 753862
Implantable Lead Location: 753862
Implantable Pulse Generator Implant Date: 20200902
Lead Channel Impedance Value: 425 Ohm
Lead Channel Impedance Value: 475 Ohm
Lead Channel Pacing Threshold Amplitude: 0.75 V
Lead Channel Pacing Threshold Amplitude: 0.75 V
Lead Channel Pacing Threshold Amplitude: 0.75 V
Lead Channel Pacing Threshold Amplitude: 0.75 V
Lead Channel Pacing Threshold Pulse Width: 0.5 ms
Lead Channel Pacing Threshold Pulse Width: 0.5 ms
Lead Channel Pacing Threshold Pulse Width: 0.5 ms
Lead Channel Pacing Threshold Pulse Width: 0.5 ms
Lead Channel Sensing Intrinsic Amplitude: 0.7 mV
Lead Channel Sensing Intrinsic Amplitude: 6 mV
Lead Channel Setting Pacing Amplitude: 1.125
Lead Channel Setting Pacing Amplitude: 2 V
Lead Channel Setting Pacing Pulse Width: 0.5 ms
Lead Channel Setting Sensing Sensitivity: 4 mV
Pulse Gen Model: 2272
Pulse Gen Serial Number: 9159553

## 2023-08-07 NOTE — Progress Notes (Signed)
 HPI Michelle Moran returns today for followup. She is a pleasant 68 yo woman with CHB, s/p PPM insertion about 4 years ago. She has done well in the interim except she notes that she gets pain in her left shoulder. She denies chest pain or sob. She has traveled to [place] to visit family. No edema. She feels well. Allergies  Allergen Reactions   Pollen Extract Other (See Comments)     Current Outpatient Medications  Medication Sig Dispense Refill   acetaminophen  (TYLENOL ) 325 MG tablet Take 1-2 tablets (325-650 mg total) by mouth every 6 (six) hours as needed for mild pain. 30 tablet 0   atenolol  (TENORMIN ) 50 MG tablet Take 1 tablet (50 mg total) by mouth 2 (two) times daily. 180 tablet 3   butalbital-acetaminophen -caffeine (FIORICET) 50-325-40 MG tablet Take 1 tablet by mouth 3 (three) times daily as needed.     cholecalciferol (VITAMIN D3) 25 MCG (1000 UT) tablet Take 1,000 Units by mouth daily.     Dapsone 5 % topical gel SMARTSIG:1 sparingly Topical Twice Daily     esomeprazole (NEXIUM) 40 MG capsule Take 40 mg by mouth daily.     montelukast (SINGULAIR) 10 MG tablet Take 10 mg by mouth daily as needed.     nitroGLYCERIN  (NITROSTAT ) 0.4 MG SL tablet Place 1 tablet (0.4 mg total) under the tongue every 5 (five) minutes as needed for chest pain. 25 tablet 3   ofloxacin (OCUFLOX) 0.3 % ophthalmic solution      Omega-3 Fatty Acids (FISH OIL) 1000 MG CPDR Take 1,000 mg by mouth daily.     rosuvastatin  (CRESTOR ) 10 MG tablet Take 1 tablet (10 mg total) by mouth daily. 90 tablet 3   sucralfate (CARAFATE) 1 GM/10ML suspension Take 1 g by mouth 4 (four) times daily -  with meals and at bedtime.     No current facility-administered medications for this visit.     Past Medical History:  Diagnosis Date   Allergy    Encounter for care of pacemaker 08/08/2019   Hyperlipidemia    Hypertension    Pacemaker [employer] dual chamber MRI Assurity Dr-Rf - [id number] on 03/05/19 03/05/2019   Third degree  AV block (HCC) 03/04/2019    ROS:   All systems reviewed and negative except as noted in the HPI.   Past Surgical History:  Procedure Laterality Date   CHOLECYSTECTOMY  2004   EYE SURGERY     INNER EAR SURGERY  2004   PACEMAKER IMPLANT N/A 03/05/2019   Procedure: PACEMAKER IMPLANT;  Surgeon: Waddell Danelle ORN, MD;  Location: [facility];  Service: Cardiovascular;  Laterality: N/A;     Family History  Problem Relation Age of Onset   Heart disease Father    Diabetes Father      Social History   Socioeconomic History   Marital status: Married    Spouse name: Not on file   Number of children: 3   Years of education: Not on file   Highest education level: Not on file  Occupational History   Not on file  Tobacco Use   Smoking status: Never   Smokeless tobacco: Never  Vaping Use   Vaping status: Never Used  Substance and Sexual Activity   Alcohol use: No   Drug use: No   Sexual activity: Not on file  Other Topics Concern   Not on file  Social History Narrative   Not on file   Social Drivers  of Health   Financial Resource Strain: Not on file  Food Insecurity: Not on file  Transportation Needs: Not on file  Physical Activity: Not on file  Stress: Not on file  Social Connections: Not on file  Intimate Partner Violence: Not on file     BP 116/76   Pulse 67   Ht 5' 5 (1.651 m)   Wt 148 lb (67.1 kg)   SpO2 97%   BMI 24.63 kg/m   Physical Exam:  Well appearing 68 yo woman, NAD HEENT: Unremarkable Neck:  No JVD, no thyromegally Lymphatics:  No adenopathy Back:  No CVA tenderness Lungs:  Clear with no wheezes HEART:  Regular rate rhythm, no murmurs, no rubs, no clicks Abd:  soft, positive bowel sounds, no organomegally, no rebound, no guarding Ext:  2 plus pulses, no edema, no cyanosis, no clubbing Skin:  No rashes no nodules Neuro:  CN II through XII intact, motor grossly intact  EKG - NSR with ventricular pacing  DEVICE  Normal device  function.  See PaceArt for details.   Assess/Plan:  1. CHB - she has no escape today. She is asymptomatic s/p PPM. 2. PPM - her [employer] DDD PM is working normally. She will followup in one year 3. Left shoulder pain - she will take tylenol  as needed.   Danelle Waddell HERO.D.

## 2023-08-07 NOTE — Patient Instructions (Addendum)

## 2023-09-24 ENCOUNTER — Ambulatory Visit
Admission: RE | Admit: 2023-09-24 | Discharge: 2023-09-24 | Disposition: A | Discharge status: Home / Self Care | Payer: 59 | Source: Ambulatory Visit | Attending: Internal Medicine | Admitting: Internal Medicine

## 2023-09-24 DIAGNOSIS — Z1231 Encounter for screening mammogram for malignant neoplasm of breast: Secondary | ICD-10-CM

## 2023-09-27 NOTE — Progress Notes (Unsigned)
  Cardiology Office Note:  .   Date:  09/27/2023  ID:  Jacklynn Ganong, DOB 11-09-1955, MRN [id number] PCP: Galvin Proffer, MD  [facility] Providers Cardiologist:  Truett Mainland, MD PCP: Galvin Proffer, MD  No chief complaint on file.     History of Present Illness: .    Catalea Adara Kittle is a 68 y.o. female with hypertension, hyperlipidemia, prediabetes, GERD, now s/p dual chamber pacemaker for symptomatic third degree AV block.   ***Patient is doing well, does not have any complaints today.  Blood pressure slightly elevated today, but she has not taken her medications.  Generally, blood pressure is very well-controlled.  She recently went on a trip to [place] where she walked to 565 steps without any difficulty.  She is planning to visit [place] in November, where she will spend 2 months.  There were no vitals filed for this visit.    ROS:  Review of Systems  Cardiovascular:  Negative for chest pain, dyspnea on exertion, leg swelling, palpitations and syncope.     Studies Reviewed: Marland Kitchen    EKG 04/26/2023: AV dual-paced rhythm No previous ECGs available  Independently interpreted 04/2023: Chol 175, TG 118, HDL 65, LDL 89  CTA cor 05/19/2022: 1. Coronary calcium score of 0. This was 0 percentile for age-, sex, and race-matched controls. Sensitivity for detecting calcium in the RCA is limited by beam artifact from pacing wires. 2. Normal coronary origin with right dominance. 3. Normal coronary arteries. CAD RADS 0. Study limited for analysis of RCA coronary calcium due to beam artifact from pacing leads in RA and RV but no obvious plaque is present. 4.  Consider non atherosclerotic causes of chest pain.    Physical Exam:   Physical Exam Vitals and nursing note reviewed.  Constitutional:      General: She is not in acute distress. Neck:     Vascular: No JVD.  Cardiovascular:     Rate and Rhythm: Normal rate and regular rhythm.      Heart sounds: Normal heart sounds. No murmur heard. Pulmonary:     Effort: Pulmonary effort is normal.     Breath sounds: Normal breath sounds. No wheezing or rales.  Musculoskeletal:     Right lower leg: No edema.     Left lower leg: No edema.      VISIT DIAGNOSES: No diagnosis found.    ASSESSMENT AND PLAN: .    Latitia Housewright is a 68 y.o. female with hypertension, hyperlipidemia, prediabetes, GERD, now s/p dual chamber pacemaker for symptomatic third degree AV block.   *** Hypertension: Reasonably well-controlled.  Refilled atenolol 50 mg daily which has worked well for her.  *** Complete AV block: Well-functioning chamber pacemaker.  Will arrange for remote and in person checks.  *** Mixed hyperlipidemia: On rosuvastatin 10 mg daily. HDL 65, LDL 89 (04/2023). In absence of coronary calcification, this is a respectable number. Continue rosuvastatin 10 mg daily.  No orders of the defined types were placed in this encounter.    F/u in 6 months  Signed, Elder Negus, MD

## 2023-09-28 ENCOUNTER — Ambulatory Visit: Attending: Cardiology | Admitting: Cardiology

## 2023-09-28 ENCOUNTER — Encounter: Payer: Self-pay | Admitting: Cardiology

## 2023-09-28 VITALS — BP 130/90 | HR 74 | Resp 16 | Ht 65.0 in | Wt 141.0 lb

## 2023-09-28 DIAGNOSIS — I4719 Other supraventricular tachycardia: Secondary | ICD-10-CM | POA: Diagnosis not present

## 2023-09-28 DIAGNOSIS — I442 Atrioventricular block, complete: Secondary | ICD-10-CM | POA: Diagnosis not present

## 2023-09-28 DIAGNOSIS — I10 Essential (primary) hypertension: Secondary | ICD-10-CM | POA: Diagnosis not present

## 2023-09-28 DIAGNOSIS — E782 Mixed hyperlipidemia: Secondary | ICD-10-CM

## 2023-09-28 MED ORDER — AMLODIPINE BESYLATE 2.5 MG PO TABS: 2.5000 mg | ORAL_TABLET | Freq: Every day | ORAL | 3 refills | Status: DC

## 2023-09-28 MED ORDER — ROSUVASTATIN CALCIUM 10 MG PO TABS: 10.0000 mg | ORAL_TABLET | Freq: Every day | ORAL | 3 refills | Status: DC

## 2023-09-28 MED ORDER — ATENOLOL 50 MG PO TABS: 50.0000 mg | ORAL_TABLET | Freq: Two times a day (BID) | ORAL | 3 refills | Status: DC

## 2023-09-28 NOTE — Patient Instructions (Signed)
 Medication Instructions:   START TAKING AMLODIPINE 2.5 MG BY AT NOON EVERYDAY  *If you need a refill on your cardiac medications before your next appointment, please call your pharmacy*    Follow-Up: At [facility], you and your health needs are our priority.  As part of our continuing mission to provide you with exceptional heart care, our providers are all part of one team.  This team includes your primary Cardiologist (physician) and Advanced Practice Providers or APPs (Physician Assistants and Nurse Practitioners) who all work together to provide you with the care you need, when you need it.  Your next appointment:   6 month(s)  Provider:   DR. Rosemary Holms        1st Floor: - Lobby - Registration  - Pharmacy  - Lab - Cafe  2nd Floor: - PV Lab - Diagnostic Testing (echo, CT, nuclear med)  3rd Floor: - Vacant  4th Floor: - TCTS (cardiothoracic surgery) - AFib Clinic - Structural Heart Clinic - Vascular Surgery  - Vascular Ultrasound  5th Floor: - HeartCare Cardiology (general and EP) - Clinical Pharmacy for coumadin, hypertension, lipid, weight-loss medications, and med management appointments    Valet parking services will be available as well.

## 2023-09-28 NOTE — Progress Notes (Deleted)
  Cardiology Office Note:  .   Date:  09/28/2023  ID:  Michelle Moran, DOB 01/13/1956, MRN [id number] PCP: Galvin Proffer, MD  [facility] Providers Cardiologist:  Truett Mainland, MD PCP: Galvin Proffer, MD  Chief Complaint  Patient presents with   Essential hypertension   Follow-up    6 month     Michelle Moran is a 68 y.o. female with *** Discussed the use of AI scribe software for clinical note transcription with the patient, who gave verbal consent to proceed.  History of Present Illness       Vitals:   09/28/23 1110  BP: (!) 130/90  Pulse: 74  Resp: 16  SpO2: 98%      ROS      Studies Reviewed: .        *** Independently interpreted ***/202***: Chol ***, TG ***, HDL ***, LDL *** HbA1C ***% Hb *** Cr *** ***  Risk Assessment/Calculations:   {Does this patient have ATRIAL FIBRILLATION?:[phone]}    Physical Exam   VISIT DIAGNOSES:   ICD-10-CM   1. Essential hypertension  I10 atenolol (TENORMIN) 50 MG tablet    2. Mixed hyperlipidemia  E78.2 atenolol (TENORMIN) 50 MG tablet    3. Third degree AV block (HCC)  I44.2        Michelle Moran is a 68 y.o. female with *** Assessment and Plan Assessment & Plan       {Are you ordering a CV Procedure (e.g. stress test, cath, DCCV, TEE, etc)?   Press F2        :[id number]}    Meds ordered this encounter  Medications   amLODipine (NORVASC) 2.5 MG tablet    Sig: Take 1 tablet (2.5 mg total) by mouth daily at 12 noon.    Dispense:  90 tablet    Refill:  3   atenolol (TENORMIN) 50 MG tablet    Sig: Take 1 tablet (50 mg total) by mouth 2 (two) times daily.    Dispense:  180 tablet    Refill:  3   rosuvastatin (CRESTOR) 10 MG tablet    Sig: Take 1 tablet (10 mg total) by mouth daily.    Dispense:  90 tablet    Refill:  3     F/u in ***  Signed, Elder Negus, MD

## 2023-10-04 ENCOUNTER — Ambulatory Visit (INDEPENDENT_AMBULATORY_CARE_PROVIDER_SITE_OTHER): Payer: 59

## 2023-10-04 ENCOUNTER — Ambulatory Visit: Admitting: Cardiology

## 2023-10-04 DIAGNOSIS — I442 Atrioventricular block, complete: Secondary | ICD-10-CM

## 2023-10-04 LAB — CUP PACEART REMOTE DEVICE CHECK
Battery Remaining Longevity: 66 mo
Battery Remaining Percentage: 57 %
Battery Voltage: 3.01 V
Brady Statistic AP VP Percent: 28 %
Brady Statistic AP VS Percent: 1 %
Brady Statistic AS VP Percent: 72 %
Brady Statistic AS VS Percent: 1 %
Brady Statistic RA Percent Paced: 28 %
Brady Statistic RV Percent Paced: 99 %
Date Time Interrogation Session: 20250403020018
Implantable Lead Connection Status: 753985
Implantable Lead Connection Status: 753985
Implantable Lead Implant Date: 20200902
Implantable Lead Implant Date: 20200902
Implantable Lead Location: 753862
Implantable Lead Location: 753862
Implantable Pulse Generator Implant Date: 20200902
Lead Channel Impedance Value: 440 Ohm
Lead Channel Impedance Value: 490 Ohm
Lead Channel Pacing Threshold Amplitude: 0.75 V
Lead Channel Pacing Threshold Amplitude: 0.75 V
Lead Channel Pacing Threshold Pulse Width: 0.5 ms
Lead Channel Pacing Threshold Pulse Width: 0.5 ms
Lead Channel Sensing Intrinsic Amplitude: 1.9 mV
Lead Channel Sensing Intrinsic Amplitude: 4.6 mV
Lead Channel Setting Pacing Amplitude: 1 V
Lead Channel Setting Pacing Amplitude: 2 V
Lead Channel Setting Pacing Pulse Width: 0.5 ms
Lead Channel Setting Sensing Sensitivity: 4 mV
Pulse Gen Model: 2272
Pulse Gen Serial Number: 9159553

## 2023-10-07 ENCOUNTER — Encounter: Payer: Self-pay | Admitting: Internal Medicine

## 2023-11-14 NOTE — Progress Notes (Signed)
 Remote pacemaker transmission.

## 2023-11-15 ENCOUNTER — Other Ambulatory Visit: Payer: Self-pay | Admitting: Cardiology

## 2024-01-03 ENCOUNTER — Ambulatory Visit (INDEPENDENT_AMBULATORY_CARE_PROVIDER_SITE_OTHER): Payer: 59

## 2024-01-03 DIAGNOSIS — I442 Atrioventricular block, complete: Secondary | ICD-10-CM | POA: Diagnosis not present

## 2024-01-03 LAB — CUP PACEART REMOTE DEVICE CHECK
Battery Remaining Longevity: 64 mo
Battery Remaining Percentage: 54 %
Battery Voltage: 3.01 V
Brady Statistic AP VP Percent: 26 %
Brady Statistic AP VS Percent: 1 %
Brady Statistic AS VP Percent: 74 %
Brady Statistic AS VS Percent: 1 %
Brady Statistic RA Percent Paced: 26 %
Brady Statistic RV Percent Paced: 99 %
Date Time Interrogation Session: 20250703020014
Implantable Lead Connection Status: 753985
Implantable Lead Connection Status: 753985
Implantable Lead Implant Date: 20200902
Implantable Lead Implant Date: 20200902
Implantable Lead Location: 753862
Implantable Lead Location: 753862
Implantable Pulse Generator Implant Date: 20200902
Lead Channel Impedance Value: 430 Ohm
Lead Channel Impedance Value: 540 Ohm
Lead Channel Pacing Threshold Amplitude: 0.75 V
Lead Channel Pacing Threshold Amplitude: 0.875 V
Lead Channel Pacing Threshold Pulse Width: 0.5 ms
Lead Channel Pacing Threshold Pulse Width: 0.5 ms
Lead Channel Sensing Intrinsic Amplitude: 1.8 mV
Lead Channel Sensing Intrinsic Amplitude: 4.6 mV
Lead Channel Setting Pacing Amplitude: 1.125
Lead Channel Setting Pacing Amplitude: 2 V
Lead Channel Setting Pacing Pulse Width: 0.5 ms
Lead Channel Setting Sensing Sensitivity: 4 mV
Pulse Gen Model: 2272
Pulse Gen Serial Number: 9159553

## 2024-01-10 ENCOUNTER — Ambulatory Visit: Payer: Self-pay | Admitting: Internal Medicine

## 2024-04-03 ENCOUNTER — Ambulatory Visit: Payer: 59

## 2024-04-03 DIAGNOSIS — I442 Atrioventricular block, complete: Secondary | ICD-10-CM

## 2024-04-08 LAB — CUP PACEART REMOTE DEVICE CHECK
Battery Remaining Longevity: 61 mo
Battery Remaining Percentage: 52 %
Battery Voltage: 2.99 V
Brady Statistic AP VP Percent: 26 %
Brady Statistic AP VS Percent: 1 %
Brady Statistic AS VP Percent: 74 %
Brady Statistic AS VS Percent: 1 %
Brady Statistic RA Percent Paced: 26 %
Brady Statistic RV Percent Paced: 99 %
Date Time Interrogation Session: 20251002020012
Implantable Lead Connection Status: 753985
Implantable Lead Connection Status: 753985
Implantable Lead Implant Date: 20200902
Implantable Lead Implant Date: 20200902
Implantable Lead Location: 753862
Implantable Lead Location: 753862
Implantable Pulse Generator Implant Date: 20200902
Lead Channel Impedance Value: 440 Ohm
Lead Channel Impedance Value: 530 Ohm
Lead Channel Pacing Threshold Amplitude: 0.75 V
Lead Channel Pacing Threshold Amplitude: 0.75 V
Lead Channel Pacing Threshold Pulse Width: 0.5 ms
Lead Channel Pacing Threshold Pulse Width: 0.5 ms
Lead Channel Sensing Intrinsic Amplitude: 1.3 mV
Lead Channel Sensing Intrinsic Amplitude: 4.6 mV
Lead Channel Setting Pacing Amplitude: 1 V
Lead Channel Setting Pacing Amplitude: 2 V
Lead Channel Setting Pacing Pulse Width: 0.5 ms
Lead Channel Setting Sensing Sensitivity: 4 mV
Pulse Gen Model: 2272
Pulse Gen Serial Number: 9159553

## 2024-04-08 NOTE — Progress Notes (Signed)
 Remote PPM Transmission

## 2024-04-10 ENCOUNTER — Ambulatory Visit: Payer: Self-pay | Admitting: Internal Medicine

## 2024-04-10 NOTE — Progress Notes (Signed)
 Remote PPM Transmission

## 2024-05-15 ENCOUNTER — Other Ambulatory Visit (HOSPITAL_COMMUNITY): Payer: Self-pay | Admitting: Orthopedic Surgery

## 2024-05-15 DIAGNOSIS — M542 Cervicalgia: Secondary | ICD-10-CM

## 2024-05-19 ENCOUNTER — Ambulatory Visit: Attending: Cardiology | Admitting: Cardiology

## 2024-05-19 ENCOUNTER — Encounter: Payer: Self-pay | Admitting: Cardiology

## 2024-05-19 DIAGNOSIS — E782 Mixed hyperlipidemia: Secondary | ICD-10-CM

## 2024-05-19 DIAGNOSIS — I10 Essential (primary) hypertension: Secondary | ICD-10-CM

## 2024-05-19 MED ORDER — ATENOLOL 50 MG PO TABS
50.0000 mg | ORAL_TABLET | Freq: Two times a day (BID) | ORAL | 3 refills | Status: AC
Start: 2024-05-19 — End: ?

## 2024-05-19 MED ORDER — ROSUVASTATIN CALCIUM 10 MG PO TABS
10.0000 mg | ORAL_TABLET | Freq: Every day | ORAL | 3 refills | Status: AC
Start: 2024-05-19 — End: ?

## 2024-05-19 NOTE — Patient Instructions (Signed)
 Follow-Up: At [facility], you and your health needs are our priority.  As part of our continuing mission to provide you with exceptional heart care, our providers are all part of one team.  This team includes your primary Cardiologist (physician) and Advanced Practice Providers or APPs (Physician Assistants and Nurse Practitioners) who all work together to provide you with the care you need, when you need it.  Your next appointment:   1 year(s)  Provider:   Cody Das, MD

## 2024-05-19 NOTE — Progress Notes (Signed)
 Cardiology Office Note:  .   Date:  05/19/2024  ID:  Michelle Moran, DOB [birth year], MRN [id number] PCP: Pia Kerney SQUIBB, MD  [facility] Providers Cardiologist:  Michelle Lawrence, MD PCP: Pia Kerney SQUIBB, MD  Chief Complaint  Patient presents with   Hypertension   Follow-up    6 months      History of Present Illness: .    Michelle Moran is a 69 y.o. female with hypertension, hyperlipidemia, prediabetes, GERD, now s/p dual chamber pacemaker for symptomatic third degree AV block.   Patient is doing well.  Blood pressure slightly elevated today, but very well-controlled at home.  She has not had to take her as needed amlodipine  during the day.  Reviewed pacemaker tracings, occasional atrial tachycardia noted.  Vitals:   05/19/24 1507  BP: (!) 143/72  Pulse: 73  Resp: 16  SpO2: 95%      ROS:  Review of Systems  Cardiovascular:  Negative for chest pain, dyspnea on exertion, leg swelling, palpitations and syncope.     Studies Reviewed: SABRA    EKG 04/26/2023: AV dual-paced rhythm No previous ECGs available  Independently interpreted 04/2023: Chol 175, TG 118, HDL 65, LDL 89  CTA cor 05/19/2022: 1. Coronary calcium  score of 0. This was 0 percentile for age-, sex, and race-matched controls. Sensitivity for detecting calcium  in the RCA is limited by beam artifact from pacing wires. 2. Normal coronary origin with right dominance. 3. Normal coronary arteries. CAD RADS 0. Study limited for analysis of RCA coronary calcium  due to beam artifact from pacing leads in RA and RV but no obvious plaque is present. 4.  Consider non atherosclerotic causes of chest pain.    Physical Exam:   Physical Exam Vitals and nursing note reviewed.  Constitutional:      General: She is not in acute distress. Neck:     Vascular: No JVD.  Cardiovascular:     Rate and Rhythm: Normal rate and regular rhythm.     Heart sounds: Normal heart sounds. No  murmur heard. Pulmonary:     Effort: Pulmonary effort is normal.     Breath sounds: Normal breath sounds. No wheezing or rales.  Musculoskeletal:     Right lower leg: No edema.     Left lower leg: No edema.      VISIT DIAGNOSES:   ICD-10-CM   1. Essential hypertension  I10 atenolol  (TENORMIN ) 50 MG tablet    2. Mixed hyperlipidemia  E78.2 atenolol  (TENORMIN ) 50 MG tablet         ASSESSMENT AND PLAN: .    [name] is a 68 y.o. female with hypertension, hyperlipidemia, prediabetes, GERD, now s/p dual chamber pacemaker for symptomatic third degree AV block.   Hypertension: Continue atenolol  50 mg twice daily. In addition, she has amlodipine  2.5 mg that she takes as needed if blood pressure is elevated, has not had to take it recently.  Complete AV block: Well-functioning chamber pacemaker.   Asymptomatic episodes of atrial tachycardia, monitor for now.    Mixed hyperlipidemia: Continue rosuvastatin  10 mg daily.  Meds ordered this encounter  Medications   atenolol  (TENORMIN ) 50 MG tablet    Sig: Take 1 tablet (50 mg total) by mouth 2 (two) times daily.    Dispense:  180 tablet    Refill:  3   rosuvastatin  (CRESTOR ) 10 MG tablet    Sig: Take 1 tablet (10 mg total) by mouth daily.    Dispense:  90 tablet    Refill:  3     F/u in 1 year  Signed, Michelle JINNY Lawrence, MD

## 2024-05-20 ENCOUNTER — Ambulatory Visit (HOSPITAL_COMMUNITY)
Admission: RE | Admit: 2024-05-20 | Discharge: 2024-05-20 | Disposition: A | Discharge status: Home / Self Care | Source: Ambulatory Visit | Attending: Orthopedic Surgery | Admitting: Orthopedic Surgery

## 2024-05-20 DIAGNOSIS — M542 Cervicalgia: Secondary | ICD-10-CM | POA: Insufficient documentation

## 2024-05-20 NOTE — Progress Notes (Signed)
 Patient was monitored by this RN during MRI scan due to presence of a pacemaker. Cardiac rhythm was continuously monitored throughout the procedure. Prior to the start of the scan, the pacemaker was placed in MRI-safe mode by the MRI technician and/or pacemaker representative. Following the completion of the scan, the device was returned to its pre-MRI settings. Neurological status and orientation post-procedure were unchanged from baseline.   Pre-procedure Heart Rate (Prior to being placed in MRI safe mode):63 Set to: 85 Post-procedure Heart Rate (Once pacemaker is returned to baseline mode):  60

## 2024-05-20 NOTE — CV Procedure (Signed)
  Device system confirmed to be MRI conditional, with implant date > 6 weeks ago, and no evidence of abandoned or epicardial leads in review of most recent CXR  Device last cleared by EP Provider: leverne on 05/20/24  Clearance is good through for 1 year as long as parameters remain stable at time of check. If pt undergoes a cardiac device procedure during that time, they should be re-cleared.   Tachy-therapies to be programmed off if applicable with device back to pre-MRI settings after completion of exam.  [employer] will be present for programming for the MRI.   Tobias LITTIE Leander, RT  05/20/2024 1:16 PM

## 2024-06-04 ENCOUNTER — Other Ambulatory Visit: Payer: Self-pay | Admitting: Cardiology

## 2024-06-04 DIAGNOSIS — E782 Mixed hyperlipidemia: Secondary | ICD-10-CM

## 2024-06-04 DIAGNOSIS — I10 Essential (primary) hypertension: Secondary | ICD-10-CM

## 2024-07-03 ENCOUNTER — Ambulatory Visit (INDEPENDENT_AMBULATORY_CARE_PROVIDER_SITE_OTHER)

## 2024-07-03 DIAGNOSIS — I4719 Other supraventricular tachycardia: Secondary | ICD-10-CM

## 2024-07-04 LAB — CUP PACEART REMOTE DEVICE CHECK
Battery Remaining Longevity: 58 mo
Battery Remaining Percentage: 49 %
Battery Voltage: 2.99 V
Brady Statistic AP VP Percent: 26 %
Brady Statistic AP VS Percent: 1 %
Brady Statistic AS VP Percent: 74 %
Brady Statistic AS VS Percent: 1 %
Brady Statistic RA Percent Paced: 26 %
Brady Statistic RV Percent Paced: 99 %
Date Time Interrogation Session: 20260102040014
Implantable Lead Connection Status: 753985
Implantable Lead Connection Status: 753985
Implantable Lead Implant Date: 20200902
Implantable Lead Implant Date: 20200902
Implantable Lead Location: 753862
Implantable Lead Location: 753862
Implantable Pulse Generator Implant Date: 20200902
Lead Channel Impedance Value: 440 Ohm
Lead Channel Impedance Value: 490 Ohm
Lead Channel Pacing Threshold Amplitude: 0.75 V
Lead Channel Pacing Threshold Amplitude: 0.75 V
Lead Channel Pacing Threshold Pulse Width: 0.5 ms
Lead Channel Pacing Threshold Pulse Width: 0.5 ms
Lead Channel Sensing Intrinsic Amplitude: 1.3 mV
Lead Channel Sensing Intrinsic Amplitude: 5.5 mV
Lead Channel Setting Pacing Amplitude: 1 V
Lead Channel Setting Pacing Amplitude: 2 V
Lead Channel Setting Pacing Pulse Width: 0.5 ms
Lead Channel Setting Sensing Sensitivity: 4 mV
Pulse Gen Model: 2272
Pulse Gen Serial Number: 9159553

## 2024-07-06 ENCOUNTER — Ambulatory Visit: Payer: Self-pay | Admitting: Internal Medicine

## 2024-07-08 NOTE — Progress Notes (Deleted)
 Remote PPM Transmission

## 2024-07-09 NOTE — Progress Notes (Signed)
 Remote PPM Transmission

## 2024-10-02 ENCOUNTER — Encounter

## 2025-01-01 ENCOUNTER — Encounter

## 2025-04-02 ENCOUNTER — Encounter

## 2025-07-02 ENCOUNTER — Encounter

## 2025-10-01 ENCOUNTER — Encounter
